# Patient Record
Sex: Male | Born: 1985 | Smoking: Former smoker
Health system: Southern US, Community
[De-identification: ages and names within clinical notes are randomized; demographics above are authoritative.]

## PROBLEM LIST (undated history)

## (undated) DIAGNOSIS — I639 Cerebral infarction, unspecified: Secondary | ICD-10-CM

## (undated) DIAGNOSIS — I10 Essential (primary) hypertension: Secondary | ICD-10-CM

## (undated) DIAGNOSIS — N189 Chronic kidney disease, unspecified: Secondary | ICD-10-CM

## (undated) HISTORY — PX: NO PAST SURGERIES: SHX2092

---

## 2013-12-21 DIAGNOSIS — I639 Cerebral infarction, unspecified: Secondary | ICD-10-CM

## 2013-12-21 HISTORY — DX: Cerebral infarction, unspecified: I63.9

## 2017-06-08 DIAGNOSIS — N186 End stage renal disease: Secondary | ICD-10-CM

## 2018-08-19 ENCOUNTER — Other Ambulatory Visit
Admission: RE | Admit: 2018-08-19 | Discharge: 2018-08-19 | Disposition: A | Discharge status: Home / Self Care | Payer: Medicare Other | Source: Ambulatory Visit | Attending: Nephrology | Admitting: Nephrology

## 2018-08-19 DIAGNOSIS — N186 End stage renal disease: Secondary | ICD-10-CM | POA: Diagnosis present

## 2018-08-19 LAB — POTASSIUM: POTASSIUM: 3.5 mmol/L (ref 3.5–5.1)

## 2018-10-14 ENCOUNTER — Other Ambulatory Visit
Admission: RE | Admit: 2018-10-14 | Discharge: 2018-10-14 | Disposition: A | Discharge status: Home / Self Care | Payer: Medicare Other | Source: Ambulatory Visit | Attending: Nephrology | Admitting: Nephrology

## 2018-10-14 DIAGNOSIS — N186 End stage renal disease: Secondary | ICD-10-CM | POA: Diagnosis present

## 2018-10-14 LAB — PHOSPHORUS: Phosphorus: 5.6 mg/dL — ABNORMAL HIGH (ref 2.5–4.6)

## 2019-02-05 ENCOUNTER — Emergency Department: Payer: Medicare Other

## 2019-02-05 ENCOUNTER — Inpatient Hospital Stay
Admission: EM | Admit: 2019-02-05 | Discharge: 2019-02-07 | DRG: 377 | Disposition: A | Discharge status: Home / Self Care | Payer: Medicare Other | Attending: Internal Medicine | Admitting: Internal Medicine

## 2019-02-05 ENCOUNTER — Other Ambulatory Visit: Payer: Self-pay

## 2019-02-05 DIAGNOSIS — K922 Gastrointestinal hemorrhage, unspecified: Secondary | ICD-10-CM

## 2019-02-05 DIAGNOSIS — N186 End stage renal disease: Secondary | ICD-10-CM | POA: Diagnosis present

## 2019-02-05 DIAGNOSIS — K2270 Barrett's esophagus without dysplasia: Secondary | ICD-10-CM | POA: Diagnosis present

## 2019-02-05 DIAGNOSIS — R531 Weakness: Secondary | ICD-10-CM

## 2019-02-05 DIAGNOSIS — I120 Hypertensive chronic kidney disease with stage 5 chronic kidney disease or end stage renal disease: Secondary | ICD-10-CM | POA: Diagnosis present

## 2019-02-05 DIAGNOSIS — K259 Gastric ulcer, unspecified as acute or chronic, without hemorrhage or perforation: Secondary | ICD-10-CM | POA: Diagnosis present

## 2019-02-05 DIAGNOSIS — K921 Melena: Secondary | ICD-10-CM | POA: Diagnosis present

## 2019-02-05 DIAGNOSIS — Z716 Tobacco abuse counseling: Secondary | ICD-10-CM

## 2019-02-05 DIAGNOSIS — D62 Acute posthemorrhagic anemia: Secondary | ICD-10-CM | POA: Diagnosis present

## 2019-02-05 DIAGNOSIS — Z791 Long term (current) use of non-steroidal anti-inflammatories (NSAID): Secondary | ICD-10-CM | POA: Diagnosis not present

## 2019-02-05 DIAGNOSIS — N2581 Secondary hyperparathyroidism of renal origin: Secondary | ICD-10-CM | POA: Diagnosis present

## 2019-02-05 DIAGNOSIS — Z8679 Personal history of other diseases of the circulatory system: Secondary | ICD-10-CM

## 2019-02-05 DIAGNOSIS — I69354 Hemiplegia and hemiparesis following cerebral infarction affecting left non-dominant side: Secondary | ICD-10-CM

## 2019-02-05 DIAGNOSIS — D631 Anemia in chronic kidney disease: Secondary | ICD-10-CM | POA: Diagnosis present

## 2019-02-05 DIAGNOSIS — K269 Duodenal ulcer, unspecified as acute or chronic, without hemorrhage or perforation: Secondary | ICD-10-CM | POA: Diagnosis present

## 2019-02-05 DIAGNOSIS — Z992 Dependence on renal dialysis: Secondary | ICD-10-CM

## 2019-02-05 DIAGNOSIS — F17210 Nicotine dependence, cigarettes, uncomplicated: Secondary | ICD-10-CM | POA: Diagnosis present

## 2019-02-05 DIAGNOSIS — D649 Anemia, unspecified: Secondary | ICD-10-CM

## 2019-02-05 DIAGNOSIS — K2289 Other specified disease of esophagus: Secondary | ICD-10-CM

## 2019-02-05 DIAGNOSIS — K3189 Other diseases of stomach and duodenum: Secondary | ICD-10-CM | POA: Diagnosis present

## 2019-02-05 DIAGNOSIS — Z79899 Other long term (current) drug therapy: Secondary | ICD-10-CM | POA: Diagnosis not present

## 2019-02-05 DIAGNOSIS — K228 Other specified diseases of esophagus: Secondary | ICD-10-CM | POA: Diagnosis not present

## 2019-02-05 DIAGNOSIS — Z8249 Family history of ischemic heart disease and other diseases of the circulatory system: Secondary | ICD-10-CM | POA: Diagnosis not present

## 2019-02-05 HISTORY — DX: Cerebral infarction, unspecified: I63.9

## 2019-02-05 HISTORY — DX: Essential (primary) hypertension: I10

## 2019-02-05 HISTORY — DX: Chronic kidney disease, unspecified: N18.9

## 2019-02-05 LAB — MRSA PCR SCREENING: MRSA by PCR: NEGATIVE

## 2019-02-05 LAB — CBC WITH DIFFERENTIAL/PLATELET
Abs Immature Granulocytes: 0.04 10*3/uL (ref 0.00–0.07)
Basophils Absolute: 0 10*3/uL (ref 0.0–0.1)
Basophils Relative: 0 %
EOS PCT: 1 %
Eosinophils Absolute: 0.1 10*3/uL (ref 0.0–0.5)
HEMATOCRIT: 16.9 % — AB (ref 39.0–52.0)
HEMOGLOBIN: 5.6 g/dL — AB (ref 13.0–17.0)
IMMATURE GRANULOCYTES: 0 %
LYMPHS ABS: 1.6 10*3/uL (ref 0.7–4.0)
LYMPHS PCT: 15 %
MCH: 31.8 pg (ref 26.0–34.0)
MCHC: 33.1 g/dL (ref 30.0–36.0)
MCV: 96 fL (ref 80.0–100.0)
MONOS PCT: 9 %
Monocytes Absolute: 0.9 10*3/uL (ref 0.1–1.0)
NEUTROS PCT: 75 %
Neutro Abs: 7.9 10*3/uL — ABNORMAL HIGH (ref 1.7–7.7)
Platelets: 197 10*3/uL (ref 150–400)
RBC: 1.76 MIL/uL — ABNORMAL LOW (ref 4.22–5.81)
RDW: 13.5 % (ref 11.5–15.5)
WBC: 10.6 10*3/uL — ABNORMAL HIGH (ref 4.0–10.5)
nRBC: 0 % (ref 0.0–0.2)

## 2019-02-05 LAB — BASIC METABOLIC PANEL
Anion gap: 17 — ABNORMAL HIGH (ref 5–15)
BUN: 126 mg/dL — ABNORMAL HIGH (ref 6–20)
CHLORIDE: 90 mmol/L — AB (ref 98–111)
CO2: 26 mmol/L (ref 22–32)
Calcium: 8.5 mg/dL — ABNORMAL LOW (ref 8.9–10.3)
Creatinine, Ser: 12.84 mg/dL — ABNORMAL HIGH (ref 0.61–1.24)
GFR calc Af Amer: 5 mL/min — ABNORMAL LOW (ref >60)
GFR calc non Af Amer: 5 mL/min — ABNORMAL LOW (ref >60)
GLUCOSE: 99 mg/dL (ref 70–99)
Potassium: 3.6 mmol/L (ref 3.5–5.1)
Sodium: 133 mmol/L — ABNORMAL LOW (ref 135–145)

## 2019-02-05 LAB — HEMOGLOBIN: Hemoglobin: 5.4 g/dL — ABNORMAL LOW (ref 13.0–17.0)

## 2019-02-05 LAB — ABO/RH: ABO/RH(D): O POS

## 2019-02-05 LAB — PREPARE RBC (CROSSMATCH)

## 2019-02-05 MED ORDER — SODIUM CHLORIDE 0.9 % IV SOLN: 10.0000 mL/h | Freq: Once | INTRAVENOUS | Status: DC

## 2019-02-05 MED ORDER — DOCUSATE SODIUM 100 MG PO CAPS: 100.0000 mg | ORAL_CAPSULE | Freq: Two times a day (BID) | ORAL | Status: DC | PRN

## 2019-02-05 MED ORDER — PANTOPRAZOLE SODIUM 40 MG IV SOLR
40.0000 mg | Freq: Two times a day (BID) | INTRAVENOUS | Status: DC
  Administered 2019-02-05 – 2019-02-06 (×3): 40 mg via INTRAVENOUS
  Filled 2019-02-05 (×4): qty 40

## 2019-02-05 MED ORDER — PANTOPRAZOLE SODIUM 40 MG IV SOLR: 40.0000 mg | Freq: Two times a day (BID) | INTRAVENOUS | Status: DC

## 2019-02-05 MED ORDER — SODIUM CHLORIDE 0.9 % IV SOLN
80.0000 mg | Freq: Once | INTRAVENOUS | Status: AC
  Administered 2019-02-05: 80 mg via INTRAVENOUS
  Filled 2019-02-05: qty 80

## 2019-02-05 MED ORDER — SODIUM CHLORIDE 0.9 % IV SOLN
Freq: Once | INTRAVENOUS | Status: AC
  Administered 2019-02-05: 17:00:00 via INTRAVENOUS

## 2019-02-05 MED ORDER — SODIUM CHLORIDE 0.9 % IV SOLN
8.0000 mg/h | INTRAVENOUS | Status: DC
  Administered 2019-02-05 – 2019-02-06 (×4): 8 mg/h via INTRAVENOUS
  Filled 2019-02-05 (×3): qty 80

## 2019-02-05 NOTE — ED Notes (Signed)
Hemoccult positive 

## 2019-02-05 NOTE — ED Triage Notes (Signed)
Pt arrived via [facility] from home with c/o weakness for the last couple of days. EMS states pt had dialysis on Friday and has been progressively getting weaker. EMS states today pt had trouble walking and standing on his own.

## 2019-02-05 NOTE — ED Notes (Signed)
Rate changed to 150 ml/hr per MD order.

## 2019-02-05 NOTE — ED Notes (Signed)
ED TO INPATIENT HANDOFF REPORT  Name/Age/Gender Jose Vance 33 y.o. male  Code Status Full  Home/SNF/Other Home  Chief Complaint weakness  Level of Care/Admitting Diagnosis ED Disposition    ED Disposition Condition [place]: [place] [100120]  Level of Care: Med-Surg [16]  Diagnosis: GI bleed [950932]  Admitting Physician: Vaughan Basta [[id number]]  Attending Physician: Vaughan Basta [phone]  Estimated length of stay: past midnight tomorrow  Certification:: I certify this patient will need inpatient services for at least 2 midnights  PT Class (Do Not Modify): Inpatient [101]  PT Acc Code (Do Not Modify): Private [1]       Medical History Past Medical History:  Diagnosis Date  . Chronic kidney disease   . Hypertension   . Stroke [facility]) 2015   partial left sided weakness    Allergies No Known Allergies  IV Location/Drains/Wounds Patient Lines/Drains/Airways Status   Active Line/Drains/Airways    Name:   Placement date:   Placement time:   Site:   Days:   Peripheral IV 02/05/19 Left Forearm   02/05/19    -    Forearm   less than 1   Peripheral IV 02/05/19 Left Forearm   02/05/19    1918    Forearm   less than 1          Labs/Imaging Results for orders placed or performed during the hospital encounter of 02/05/19 (from the past 48 hour(s))  CBC with Differential     Status: Abnormal   Collection Time: 02/05/19  3:14 PM  Result Value Ref Range   WBC 10.6 (H) 4.0 - 10.5 K/uL   RBC 1.76 (L) 4.22 - 5.81 MIL/uL   Hemoglobin 5.6 (L) 13.0 - 17.0 g/dL   HCT 16.9 (L) 39.0 - 52.0 %   MCV 96.0 80.0 - 100.0 fL   MCH 31.8 26.0 - 34.0 pg   MCHC 33.1 30.0 - 36.0 g/dL   RDW 13.5 11.5 - 15.5 %   Platelets 197 150 - 400 K/uL   nRBC 0.0 0.0 - 0.2 %   Neutrophils Relative % 75 %   Neutro Abs 7.9 (H) 1.7 - 7.7 K/uL   Lymphocytes Relative 15 %   Lymphs Abs 1.6 0.7 - 4.0 K/uL   Monocytes Relative 9 %   Monocytes Absolute 0.9 0.1 - 1.0 K/uL   Eosinophils Relative 1 %   Eosinophils Absolute 0.1 0.0 - 0.5 K/uL   Basophils Relative 0 %   Basophils Absolute 0.0 0.0 - 0.1 K/uL   Immature Granulocytes 0 %   Abs Immature Granulocytes 0.04 0.00 - 0.07 K/uL    Comment: Performed at [facility], [address] [place], [place] [address]  Basic metabolic panel     Status: Abnormal   Collection Time: 02/05/19  3:14 PM  Result Value Ref Range   Sodium 133 (L) 135 - 145 mmol/L   Potassium 3.6 3.5 - 5.1 mmol/L   Chloride 90 (L) 98 - 111 mmol/L   CO2 26 22 - 32 mmol/L   Glucose, Bld 99 70 - 99 mg/dL   BUN 126 (H) 6 - 20 mg/dL    Comment: RESULT CONFIRMED BY MANUAL DILUTION.PMF   Creatinine, Ser 12.84 (H) 0.61 - 1.24 mg/dL   Calcium 8.5 (L) 8.9 - 10.3 mg/dL   GFR calc non Af Amer 5 (L) >60 mL/min   GFR calc Af Amer 5 (L) >60 mL/min   Anion gap 17 (H)  5 - 15    Comment: Performed at [facility], [address] [place], [place] [address]  ABO/Rh     Status: None   Collection Time: 02/05/19  3:14 PM  Result Value Ref Range   ABO/RH(D)      O POS Performed at [facility], [address] [place], [place] [address]   Prepare RBC     Status: None   Collection Time: 02/05/19  5:07 PM  Result Value Ref Range   Order Confirmation      ORDER PROCESSED BY BLOOD BANK Performed at [facility], [address] [place], [place] [address]   Type and screen [place]     Status: None (Preliminary result)   Collection Time: 02/05/19  5:15 PM  Result Value Ref Range   ABO/RH(D) O POS    Antibody Screen NEG    Sample Expiration 02/08/2019    Unit Number [id number]    Blood Component Type RED CELLS,LR    Unit division 00    Status of Unit ISSUED    Transfusion Status OK TO TRANSFUSE    Crossmatch Result      Compatible Performed at [facility], [address] [place], [place] [address]    Unit Number [id number]     Blood Component Type RED CELLS,LR    Unit division 00    Status of Unit ALLOCATED    Transfusion Status OK TO TRANSFUSE    Crossmatch Result Compatible    Unit Number [id number]    Blood Component Type RED CELLS,LR    Unit division 00    Status of Unit ALLOCATED    Transfusion Status OK TO TRANSFUSE    Crossmatch Result Compatible   Hemoglobin     Status: Abnormal   Collection Time: 02/05/19  5:15 PM  Result Value Ref Range   Hemoglobin 5.4 (L) 13.0 - 17.0 g/dL    Comment: Performed at [facility], [address], [place], [place] [address]   Dg Chest 2 View  Result Date: 02/05/2019 CLINICAL DATA:  Weakness.  Shortness of breath. EXAM: CHEST - 2 VIEW COMPARISON:  None. FINDINGS: The heart size and mediastinal contours are within normal limits. Both lungs are clear. The visualized skeletal structures are unremarkable. IMPRESSION: No active cardiopulmonary disease. Electronically Signed   By: Dorise Bullion III M.D   On: 02/05/2019 17:38    Pending Labs Unresulted Labs (From admission, onward)    Start     Ordered   02/05/19 1817  Hemoglobin  Now then every 8 hours,   STAT    Comments:  Call MD, If < 7, Or drop  More than 1 gram/dl from previous report.    02/05/19 1816   Signed and Held  HIV antibody (Routine Testing)  Once,   R     Signed and Held   Signed and Held  Basic metabolic panel  Tomorrow morning,   R     Signed and Held   Signed and Held  CBC  Tomorrow morning,   R     Signed and Held          Vitals/Pain Today's Vitals   02/05/19 1954 02/05/19 1955 02/05/19 1956 02/05/19 1957  BP:  113/80    Pulse: 91 89  97  Resp: 15 13 12 18   Temp:      TempSrc:      SpO2: 100% 100%  100%  Weight:  Height:      PainSc:        Isolation Precautions No active isolations  Medications Medications  0.9 %  sodium chloride infusion (has no administration in time range)  pantoprazole (PROTONIX) 80 mg in sodium chloride 0.9 % 250 mL (0.32 mg/mL)  infusion (8 mg/hr Intravenous New Bag/Given 02/05/19 1818)  pantoprazole (PROTONIX) injection 40 mg (40 mg Intravenous Given 02/05/19 1753)  0.9 %  sodium chloride infusion ( Intravenous Hold 02/05/19 1705)  pantoprazole (PROTONIX) 80 mg in sodium chloride 0.9 % 100 mL IVPB (0 mg Intravenous Stopped 02/05/19 1816)    Mobility X1 assist

## 2019-02-05 NOTE — ED Provider Notes (Signed)
[facility] Emergency Department Provider Note  ____________________________________________   I have reviewed the triage vital signs and the nursing notes.   HISTORY  Chief Complaint Weakness   History limited by: Not Limited   HPI Jose Vance is a 33 y.o. male who presents to the emergency department today because of concerns for weakness.  He states that he weakness started 2 days ago after he finished dialysis.  The patient started feeling a little bit weak and dizzy during dialysis but it has been progressive since then.  He states it was his normal dialysis session they did not take more fluid than normal.  He is noticed that he becomes quite dizzy and lightheaded when he gets up or when he tries to walk.  This has been accompanied by shortness of breath.  He does still produce some urine but denies any recent change in his urine.  Denies any fevers.   Per medical record review patient has a history of stroke  Past Medical History:  Diagnosis Date  . Stroke [facility]) 2015   partial left sided weakness    There are no active problems to display for this patient.   Prior to Admission medications   Not on File    Allergies Patient has no known allergies.  No family history on file.  Social History Social History   Tobacco Use  . Smoking status: Not on file  Substance Use Topics  . Alcohol use: Not on file  . Drug use: Not on file    Review of Systems Constitutional: No fever/chills. Positive for generalized weakness.  Eyes: No visual changes. ENT: No sore throat. Cardiovascular: Denies chest pain. Respiratory: Positive for shortness of breath. Gastrointestinal: No abdominal pain.  No nausea, no vomiting.  No diarrhea.   Genitourinary: Negative for dysuria. Musculoskeletal: Negative for back pain. Skin: Negative for rash. Neurological: Negative for headaches, focal weakness or  numbness.  ____________________________________________   PHYSICAL EXAM:  VITAL SIGNS: ED Triage Vitals [02/05/19 1509]  Enc Vitals Group     BP 139/85     Pulse Rate (!) 116     Resp 18     Temp 98.4 F (36.9 C)     Temp Source Oral     SpO2 100 %     Weight 140 lb 10.5 oz (63.8 kg)     Height 5\' 6"  (1.676 m)     Head Circumference      Peak Flow      Pain Score 0   Constitutional: Alert and oriented.  Eyes: Conjunctivae are normal.  ENT      Head: Normocephalic and atraumatic.      Nose: No congestion/rhinnorhea.      Mouth/Throat: Mucous membranes are moist.      Neck: No stridor. Hematological/Lymphatic/Immunilogical: No cervical lymphadenopathy. Cardiovascular: Tachycardic, regular rhythm.  No murmurs, rubs, or gallops.  Respiratory: Normal respiratory effort without tachypnea nor retractions. Breath sounds are clear and equal bilaterally. No wheezes/rales/rhonchi. Gastrointestinal: Soft and non tender. No rebound. No guarding.  Rectal: Melena Musculoskeletal: Normal range of motion in all extremities. No lower extremity edema. Neurologic:  Normal speech and language. No gross focal neurologic deficits are appreciated.  Skin:  Skin is warm, dry and intact. No rash noted. Psychiatric: Mood and affect are normal. Speech and behavior are normal. Patient exhibits appropriate insight and judgment.  ____________________________________________    LABS (pertinent positives/negatives)  BMP na 133, cl 90, cr 12.84 CBC wbc 10.6, hgb 5.6, plt  197  ____________________________________________   EKG  INance Pear, attending physician, personally viewed and interpreted this EKG  EKG Time: 1512 Rate: 104 Rhythm: sinus tachycardia Axis: right axis deviation Intervals: qtc 454 QRS: narrow ST changes: no st elevation Impression: abnormal ekg   ____________________________________________    RADIOLOGY  CXR No acute  abnormality  ____________________________________________   PROCEDURES  Procedures  CRITICAL CARE Performed by: Nance Pear   Total critical care time: 35 minutes  Critical care time was exclusive of separately billable procedures and treating other patients.  Critical care was necessary to treat or prevent imminent or life-threatening deterioration.  Critical care was time spent personally by me on the following activities: development of treatment plan with patient and/or surrogate as well as nursing, discussions with consultants, evaluation of patient's response to treatment, examination of patient, obtaining history from patient or surrogate, ordering and performing treatments and interventions, ordering and review of laboratory studies, ordering and review of radiographic studies, pulse oximetry and re-evaluation of patient's condition.  ____________________________________________   INITIAL IMPRESSION / ASSESSMENT AND PLAN / ED COURSE  Pertinent labs & imaging results that were available during my care of the patient were reviewed by me and considered in my medical decision making (see chart for details).   Patient presented to the emergency department today because of concerns for weakness.  Was found to be significantly anemic.  He was guaiac positive with melanotic stool.  Did discuss with Dr. Juleen China with nephrology.  Will plan on admission.  Discussed and consented patient for blood transfusion. Will start patient on protonix for GI bleed.   ____________________________________________   FINAL CLINICAL IMPRESSION(S) / ED DIAGNOSES  Final diagnoses:  Weakness  Gastrointestinal hemorrhage, unspecified gastrointestinal hemorrhage type  Anemia, unspecified type     Note: This dictation was prepared with Dragon dictation. Any transcriptional errors that result from this process are unintentional     Nance Pear, MD 02/05/19 1753

## 2019-02-05 NOTE — H&P (Signed)
[facility] NAME: Jose Vance    MR#:  [id number]  DATE OF BIRTH:  11/01/1986  DATE OF ADMISSION:  02/05/2019  PRIMARY CARE PHYSICIAN: Patient, No Pcp Per   REQUESTING/REFERRING PHYSICIAN: Archie Balboa  CHIEF COMPLAINT:   Chief Complaint  Patient presents with  . Weakness    HISTORY OF PRESENT ILLNESS: Jose Vance  is a 33 y.o. male with a known history of CKD- ESRD on HD, Htn, Stroke- had dark stool and dark colored vomit for last 1-2 days, without any pain in abd. He felt very dizzi on trying to stand up today so came to ER> Noted to have Hb of 5. Dark tarry stool, so ER ordered blood transfusion, and requested hospitalist to admit. PAST MEDICAL HISTORY:   Past Medical History:  Diagnosis Date  . Chronic kidney disease   . Hypertension   . Stroke [facility]) 2015   partial left sided weakness    PAST SURGICAL HISTORY: No past surgical history  SOCIAL HISTORY:  Social History   Tobacco Use  . Smoking status: Current Every Day Smoker    Packs/day: 0.50  Substance Use Topics  . Alcohol use: Not on file    FAMILY HISTORY:  Family History  Problem Relation Age of Onset  . Hypertension Mother     DRUG ALLERGIES: No Known Allergies  REVIEW OF SYSTEMS:   CONSTITUTIONAL: No fever, have fatigue or weakness.  EYES: No blurred or double vision.  EARS, NOSE, AND THROAT: No tinnitus or ear pain.  RESPIRATORY: No cough, shortness of breath, wheezing or hemoptysis.  CARDIOVASCULAR: No chest pain, orthopnea, edema.  GASTROINTESTINAL: No nausea, have vomiting, no diarrhea or abdominal pain.  GENITOURINARY: No dysuria, hematuria.  ENDOCRINE: No polyuria, nocturia,  HEMATOLOGY: No anemia, easy bruising or bleeding SKIN: No rash or lesion. MUSCULOSKELETAL: No joint pain or arthritis.   NEUROLOGIC: No tingling, numbness, weakness.  PSYCHIATRY: No anxiety or depression.   MEDICATIONS AT HOME:  Prior to Admission medications    Medication Sig Start Date End Date Taking? Authorizing Provider  calcium acetate, Phos Binder, (PHOSLYRA) 667 MG/5ML SOLN Take 40 mLs by mouth 3 (three) times daily. 08/02/18  Yes [provider]  lisinopril (PRINIVIL,ZESTRIL) 20 MG tablet Take 20 mg by mouth daily. 08/02/18  Yes [provider]      PHYSICAL EXAMINATION:   VITAL SIGNS: Blood pressure 131/79, pulse 99, temperature 98.4 F (36.9 C), temperature source Oral, resp. rate 18, height 5\' 6"  (1.676 m), weight 63.8 kg, SpO2 98 %.  GENERAL:  33 y.o.-year-old patient lying in the bed with no acute distress.  EYES: Pupils equal, round, reactive to light and accommodation. No scleral icterus. Extraocular muscles intact.  Conjunctive are pale. HEENT: Head atraumatic, normocephalic. Oropharynx and nasopharynx clear.  NECK:  Supple, no jugular venous distention. No thyroid enlargement, no tenderness.  LUNGS: Normal breath sounds bilaterally, no wheezing, rales,rhonchi or crepitation. No use of accessory muscles of respiration.  CARDIOVASCULAR: S1, S2 normal. No murmurs, rubs, or gallops.  ABDOMEN: Soft, nontender, nondistended. Bowel sounds present. No organomegaly or mass.  EXTREMITIES: No pedal edema, cyanosis, or clubbing.  NEUROLOGIC: Cranial nerves II through XII are intact. Muscle strength 5/5 in all extremities. Sensation intact. Gait not checked.  PSYCHIATRIC: The patient is alert and oriented x 3.  SKIN: No obvious rash, lesion, or ulcer.   LABORATORY PANEL:   CBC Recent Labs  Lab 02/05/19 1514 02/05/19 1715  WBC 10.6*  --  HGB 5.6* 5.4*  HCT 16.9*  --   PLT 197  --   MCV 96.0  --   MCH 31.8  --   MCHC 33.1  --   RDW 13.5  --   LYMPHSABS 1.6  --   MONOABS 0.9  --   EOSABS 0.1  --   BASOSABS 0.0  --    ------------------------------------------------------------------------------------------------------------------  Chemistries  Recent Labs  Lab 02/05/19 1514  NA 133*  K 3.6  CL 90*  CO2  26  GLUCOSE 99  BUN 126*  CREATININE 12.84*  CALCIUM 8.5*   ------------------------------------------------------------------------------------------------------------------ estimated creatinine clearance is 7.5 mL/min (A) (by C-G formula based on SCr of 12.84 mg/dL (H)). ------------------------------------------------------------------------------------------------------------------ No results for input(s): TSH, T4TOTAL, T3FREE, THYROIDAB in the last 72 hours.  Invalid input(s): FREET3   Coagulation profile No results for input(s): INR, PROTIME in the last 168 hours. ------------------------------------------------------------------------------------------------------------------- No results for input(s): DDIMER in the last 72 hours. -------------------------------------------------------------------------------------------------------------------  Cardiac Enzymes No results for input(s): CKMB, TROPONINI, MYOGLOBIN in the last 168 hours.  Invalid input(s): CK ------------------------------------------------------------------------------------------------------------------ Invalid input(s): POCBNP  ---------------------------------------------------------------------------------------------------------------  Urinalysis No results found for: COLORURINE, APPEARANCEUR, LABSPEC, PHURINE, GLUCOSEU, HGBUR, BILIRUBINUR, KETONESUR, PROTEINUR, UROBILINOGEN, NITRITE, LEUKOCYTESUR   RADIOLOGY: Dg Chest 2 View  Result Date: 02/05/2019 CLINICAL DATA:  Weakness.  Shortness of breath. EXAM: CHEST - 2 VIEW COMPARISON:  None. FINDINGS: The heart size and mediastinal contours are within normal limits. Both lungs are clear. The visualized skeletal structures are unremarkable. IMPRESSION: No active cardiopulmonary disease. Electronically Signed   By: Dorise Bullion III M.D   On: 02/05/2019 17:38    EKG: Orders placed or performed during the hospital encounter of 02/05/19  . EKG 12-Lead  .  EKG 12-Lead    IMPRESSION AND PLAN:  *Symptomatic anemia Active GI bleed  Started on Protonix IV drip and ordered blood transfusion by ER. Follow serial hemoglobins. I have texted GI on call. Keep on clear liquid diet for now. Patient denies use of NSAIDs.  *End-stage renal disease on hemodialysis Nephrologist is contacted for managing dialysis while inpatient.  *Hypertension Currently blood pressure is stable and as he is having GI bleed I would like to hold on any medications.  *Active smoking Counseled to quit smoking for 4 minutes and offered nicotine patch.  All the records are reviewed and case discussed with ED provider. Management plans discussed with the patient, family and they are in agreement.  CODE STATUS: Full code    TOTAL TIME TAKING CARE OF THIS PATIENT: 45 minutes.    Vaughan Basta M.D on 02/05/2019   Between 7am to 6pm - Pager - [phone]  After 6pm go to [email or web address] - password [email or web address] [facility]  Office  [phone]  CC: Primary care physician; Patient, No Pcp Per   Note: This dictation was prepared with Dragon dictation along with smaller phrase technology. Any transcriptional errors that result from this process are unintentional.

## 2019-02-06 ENCOUNTER — Encounter: Payer: Self-pay | Admitting: *Deleted

## 2019-02-06 ENCOUNTER — Inpatient Hospital Stay: Payer: Medicare Other | Admitting: Anesthesiology

## 2019-02-06 ENCOUNTER — Encounter
Admission: EM | Disposition: A | Discharge status: Home / Self Care | Payer: Self-pay | Source: Home / Self Care | Attending: Internal Medicine

## 2019-02-06 DIAGNOSIS — K921 Melena: Secondary | ICD-10-CM

## 2019-02-06 DIAGNOSIS — K2289 Other specified disease of esophagus: Secondary | ICD-10-CM

## 2019-02-06 DIAGNOSIS — K228 Other specified diseases of esophagus: Secondary | ICD-10-CM

## 2019-02-06 DIAGNOSIS — K269 Duodenal ulcer, unspecified as acute or chronic, without hemorrhage or perforation: Secondary | ICD-10-CM

## 2019-02-06 DIAGNOSIS — Z791 Long term (current) use of non-steroidal anti-inflammatories (NSAID): Secondary | ICD-10-CM

## 2019-02-06 DIAGNOSIS — D649 Anemia, unspecified: Secondary | ICD-10-CM

## 2019-02-06 HISTORY — PX: ESOPHAGOGASTRODUODENOSCOPY: SHX5428

## 2019-02-06 LAB — BLOOD GAS, ARTERIAL
Acid-base deficit: 2.3 mmol/L — ABNORMAL HIGH (ref 0.0–2.0)
Bicarbonate: 21.4 mmol/L (ref 20.0–28.0)
FIO2: 0.21
O2 Saturation: 98.8 %
PCO2 ART: 33 mmHg (ref 32.0–48.0)
Patient temperature: 37
pH, Arterial: 7.42 (ref 7.350–7.450)
pO2, Arterial: 121 mmHg — ABNORMAL HIGH (ref 83.0–108.0)

## 2019-02-06 LAB — CBC
HCT: 17.8 % — ABNORMAL LOW (ref 39.0–52.0)
Hemoglobin: 5.9 g/dL — ABNORMAL LOW (ref 13.0–17.0)
MCH: 30.9 pg (ref 26.0–34.0)
MCHC: 33.1 g/dL (ref 30.0–36.0)
MCV: 93.2 fL (ref 80.0–100.0)
Platelets: 144 10*3/uL — ABNORMAL LOW (ref 150–400)
RBC: 1.91 MIL/uL — ABNORMAL LOW (ref 4.22–5.81)
RDW: 15.1 % (ref 11.5–15.5)
WBC: 9.3 10*3/uL (ref 4.0–10.5)
nRBC: 0 % (ref 0.0–0.2)

## 2019-02-06 LAB — HEMOGLOBIN AND HEMATOCRIT, BLOOD
HCT: 22.1 % — ABNORMAL LOW (ref 39.0–52.0)
HCT: 25.4 % — ABNORMAL LOW (ref 39.0–52.0)
Hemoglobin: 7.5 g/dL — ABNORMAL LOW (ref 13.0–17.0)
Hemoglobin: 8.4 g/dL — ABNORMAL LOW (ref 13.0–17.0)

## 2019-02-06 LAB — GLUCOSE, CAPILLARY
Glucose-Capillary: 69 mg/dL — ABNORMAL LOW (ref 70–99)
Glucose-Capillary: 159 mg/dL — ABNORMAL HIGH (ref 70–99)

## 2019-02-06 LAB — BASIC METABOLIC PANEL
Anion gap: 16 — ABNORMAL HIGH (ref 5–15)
BUN: 133 mg/dL — ABNORMAL HIGH (ref 6–20)
CALCIUM: 8.3 mg/dL — AB (ref 8.9–10.3)
CO2: 23 mmol/L (ref 22–32)
Chloride: 98 mmol/L (ref 98–111)
Creatinine, Ser: 14.01 mg/dL — ABNORMAL HIGH (ref 0.61–1.24)
GFR calc Af Amer: 5 mL/min — ABNORMAL LOW (ref >60)
GFR calc non Af Amer: 4 mL/min — ABNORMAL LOW (ref >60)
Glucose, Bld: 94 mg/dL (ref 70–99)
Potassium: 3.9 mmol/L (ref 3.5–5.1)
SODIUM: 137 mmol/L (ref 135–145)

## 2019-02-06 LAB — PREPARE RBC (CROSSMATCH)

## 2019-02-06 LAB — INFLUENZA PANEL BY PCR (TYPE A & B)
Influenza A By PCR: NEGATIVE
Influenza B By PCR: NEGATIVE

## 2019-02-06 LAB — HEMOGLOBIN
Hemoglobin: 7.3 g/dL — ABNORMAL LOW (ref 13.0–17.0)
Hemoglobin: 7.7 g/dL — ABNORMAL LOW (ref 13.0–17.0)

## 2019-02-06 SURGERY — EGD (ESOPHAGOGASTRODUODENOSCOPY)
Anesthesia: General | Laterality: Left

## 2019-02-06 MED ORDER — PROPOFOL 10 MG/ML IV BOLUS
INTRAVENOUS | Status: AC
  Filled 2019-02-06: qty 20

## 2019-02-06 MED ORDER — MORPHINE SULFATE (PF) 2 MG/ML IV SOLN: 1.0000 mg | Freq: Four times a day (QID) | INTRAVENOUS | Status: DC | PRN

## 2019-02-06 MED ORDER — MIDAZOLAM HCL 2 MG/2ML IJ SOLN
INTRAMUSCULAR | Status: AC
  Filled 2019-02-06: qty 2

## 2019-02-06 MED ORDER — DEXTROSE 50 % IV SOLN
12.5000 g | Freq: Once | INTRAVENOUS | Status: AC
  Administered 2019-02-06: 12.5 g via INTRAVENOUS
  Filled 2019-02-06 (×2): qty 50

## 2019-02-06 MED ORDER — PROPOFOL 500 MG/50ML IV EMUL
INTRAVENOUS | Status: DC | PRN
  Administered 2019-02-06: 120 ug/kg/min via INTRAVENOUS

## 2019-02-06 MED ORDER — PROPOFOL 10 MG/ML IV BOLUS
INTRAVENOUS | Status: DC | PRN
  Administered 2019-02-06: 100 mg via INTRAVENOUS

## 2019-02-06 MED ORDER — MIDAZOLAM HCL 5 MG/5ML IJ SOLN
INTRAMUSCULAR | Status: DC | PRN
  Administered 2019-02-06: 2 mg via INTRAVENOUS

## 2019-02-06 MED ORDER — SODIUM CHLORIDE 0.9 % IV SOLN
INTRAVENOUS | Status: DC
  Administered 2019-02-06: 16:00:00 via INTRAVENOUS

## 2019-02-06 MED ORDER — EPOETIN ALFA 10000 UNIT/ML IJ SOLN
10000.0000 [IU] | Freq: Once | INTRAMUSCULAR | Status: AC
  Administered 2019-02-06: 10000 [IU] via SUBCUTANEOUS

## 2019-02-06 MED ORDER — SODIUM CHLORIDE 0.9% IV SOLUTION
Freq: Once | INTRAVENOUS | Status: AC
  Administered 2019-02-06: 04:00:00 via INTRAVENOUS

## 2019-02-06 MED ORDER — ACETAMINOPHEN 325 MG PO TABS
650.0000 mg | ORAL_TABLET | Freq: Four times a day (QID) | ORAL | Status: DC | PRN
  Administered 2019-02-06 – 2019-02-07 (×2): 650 mg via ORAL
  Filled 2019-02-06 (×2): qty 2

## 2019-02-06 MED ORDER — ONDANSETRON HCL 4 MG/2ML IJ SOLN
4.0000 mg | Freq: Four times a day (QID) | INTRAMUSCULAR | Status: DC | PRN
  Administered 2019-02-06: 4 mg via INTRAVENOUS
  Filled 2019-02-06 (×2): qty 2

## 2019-02-06 MED ORDER — SODIUM CHLORIDE 0.9% IV SOLUTION
Freq: Once | INTRAVENOUS | Status: AC
  Administered 2019-02-06: 19:00:00 via INTRAVENOUS

## 2019-02-06 NOTE — Progress Notes (Signed)
Post HD assessment    02/06/19 1304  Neurological  Level of Consciousness Alert  Orientation Level Oriented X4  Respiratory  Respiratory Pattern Regular;Unlabored  Chest Assessment Chest expansion symmetrical  Cardiac  Pulse Regular  ECG Monitor Yes  Cardiac Rhythm NSR;ST  Vascular  R Radial Pulse +2  L Radial Pulse +2  Integumentary  Integumentary (WDL) X  Skin Color Appropriate for ethnicity  Musculoskeletal  Musculoskeletal (WDL) X  Generalized Weakness Yes  Assistive Device None  GU Assessment  Genitourinary (WDL) X  Genitourinary Symptoms  (HD)  Psychosocial  Psychosocial (WDL) WDL

## 2019-02-06 NOTE — Progress Notes (Signed)
Can you order PRN pain meds. The patient is complaining of a headache 5/10. Do you want to order tylenol or fioricet.

## 2019-02-06 NOTE — Anesthesia Preprocedure Evaluation (Signed)
Anesthesia Evaluation  Patient identified by MRN, date of birth, ID band Patient awake    Reviewed: Allergy & Precautions, H&P , NPO status , Patient's Chart, lab work & pertinent test results, reviewed documented beta blocker date and time   Airway Mallampati: II   Neck ROM: full    Dental  (+) Poor Dentition   Pulmonary neg pulmonary ROS, former smoker,    Pulmonary exam normal        Cardiovascular Exercise Tolerance: Good hypertension, On Medications negative cardio ROS Normal cardiovascular exam Rhythm:regular Rate:Normal     Neuro/Psych CVA, Residual Symptoms negative neurological ROS  negative psych ROS   GI/Hepatic negative GI ROS, Neg liver ROS,   Endo/Other  negative endocrine ROS  Renal/GU ESRF and DialysisRenal diseasenegative Renal ROS  negative genitourinary   Musculoskeletal   Abdominal   Peds  Hematology negative hematology ROS (+) Blood dyscrasia, anemia ,   Anesthesia Other Findings Past Medical History: No date: Chronic kidney disease No date: Hypertension 2015: Stroke ([place])     Comment:  partial left sided weakness Past Surgical History: No date: NO PAST SURGERIES BMI    Body Mass Index:  24.02 kg/m     Reproductive/Obstetrics negative OB ROS                             Anesthesia Physical Anesthesia Plan  ASA: III and emergent  Anesthesia Plan: General   Post-op Pain Management:    Induction:   PONV Risk Score and Plan:   Airway Management Planned:   Additional Equipment:   Intra-op Plan:   Post-operative Plan:   Informed Consent: I have reviewed the patients History and Physical, chart, labs and discussed the procedure including the risks, benefits and alternatives for the proposed anesthesia with the patient or authorized representative who has indicated his/her understanding and acceptance.     Dental Advisory Given  Plan Discussed with:  CRNA  Anesthesia Plan Comments:         Anesthesia Quick Evaluation

## 2019-02-06 NOTE — Anesthesia Post-op Follow-up Note (Signed)
Anesthesia QCDR form completed.        

## 2019-02-06 NOTE — Progress Notes (Signed)
HD tx start    02/06/19 0925  Vital Signs  Pulse Rate 86  Pulse Rate Source Monitor  Resp 13  BP 110/70  BP Location Left Arm  BP Method Automatic  Patient Position (if appropriate) Lying  Oxygen Therapy  SpO2 100 %  O2 Device Room Air  During Hemodialysis Assessment  Blood Flow Rate (mL/min) 400 mL/min  Arterial Pressure (mmHg) -120 mmHg  Venous Pressure (mmHg) 130 mmHg  Transmembrane Pressure (mmHg) 40 mmHg  Ultrafiltration Rate (mL/min) 430 mL/min  Dialysate Flow Rate (mL/min) 600 ml/min  Conductivity: Machine  14.1  HD Safety Checks Performed Yes  Dialysis Fluid Bolus Normal Saline  Bolus Amount (mL) 250 mL  Intra-Hemodialysis Comments Tx initiated

## 2019-02-06 NOTE — Care Management (Signed)
Amanda Morris dialysis liaison notified of admission.    

## 2019-02-06 NOTE — Progress Notes (Addendum)
Blood glucose rechecked after 12.5g of D50 given. Blood sugar rechecked after blood glucose had been obtained and was 69. Most current blood glucose level 159. Blood glucose not being processed by blood glucose machine.

## 2019-02-06 NOTE — Progress Notes (Signed)
MD messaged: The EGD was performed. They reported that they did not find any source of active bleeding. They did find ulcers yet they are not bleeding. The patient wanted to know if he would be able to have a diet when he gets back to the unit.

## 2019-02-06 NOTE — Progress Notes (Signed)
[facility] [place] NAME: Jose Vance    MR#:  [id number]  DATE OF BIRTH:  June 17, 1986  SUBJECTIVE: Admitted for symptomatic anemia with GI bleed, has been having black stool since Saturday came in because of dizziness and found to have severe anemia with hemoglobin 5.6.  Received 2 units of transfusion, hemoglobin 7.3 after 2 units of blood transfusion, seen during dialysis.  Denies abdominal pain.  Denies any use of NSAID's as well.  CHIEF COMPLAINT:   Chief Complaint  Patient presents with  . Weakness    REVIEW OF SYSTEMS:   ROS CONSTITUTIONAL: Fatigue, generalized weakness. EYES: No blurred or double vision.  EARS, NOSE, AND THROAT: No tinnitus or ear pain.  RESPIRATORY: No cough, shortness of breath, wheezing or hemoptysis.  CARDIOVASCULAR: No chest pain, orthopnea, edema.  GASTROINTESTINAL: No nausea, vomiting, diarrhea or abdominal pain.  GENITOURINARY: No dysuria, hematuria.  ENDOCRINE: No polyuria, nocturia,  HEMATOLOGY: anemic.  SKIN: No rash or lesion. MUSCULOSKELETAL: No joint pain or arthritis.   NEUROLOGIC: No tingling, numbness, weakness.  PSYCHIATRY: No anxiety or depression.   DRUG ALLERGIES:  No Known Allergies  VITALS:  Blood pressure 112/69, pulse 68, temperature 98.4 F (36.9 C), temperature source Oral, resp. rate 13, height 5\' 6"  (1.676 m), weight 67.2 kg, SpO2 100 %.  PHYSICAL EXAMINATION:  GENERAL:  33 y.o.-year-old patient lying in the bed with no acute distress.  Patient appears very thin. EYES: Pupils equal, round, reactive to light and accommodation. No scleral icterus. Extraocular muscles intact.  HEENT: Head atraumatic, normocephalic. Oropharynx and nasopharynx clear.  NECK:  Supple, no jugular venous distention. No thyroid enlargement, no tenderness.  LUNGS: Normal breath sounds bilaterally, no wheezing, rales,rhonchi or crepitation. No use of accessory muscles of respiration.   CARDIOVASCULAR: S1, S2 normal. No murmurs, rubs, or gallops.  ABDOMEN: Soft, nontender, nondistended. Bowel sounds present. No organomegaly or mass.  EXTREMITIES: No pedal edema, cyanosis, or clubbing.  NEUROLOGIC: Cranial nerves II through XII are intact. Muscle strength 5/5 in all extremities. Sensation intact. Gait not checked.  PSYCHIATRIC: The patient is alert and oriented x 3.  SKIN: No obvious rash, lesion, or ulcer.    LABORATORY PANEL:   CBC Recent Labs  Lab 02/06/19 0218 02/06/19 1054  WBC 9.3  --   HGB 5.9* 7.3*  HCT 17.8*  --   PLT 144*  --    ------------------------------------------------------------------------------------------------------------------  Chemistries  Recent Labs  Lab 02/06/19 0218  NA 137  K 3.9  CL 98  CO2 23  GLUCOSE 94  BUN 133*  CREATININE 14.01*  CALCIUM 8.3*   ------------------------------------------------------------------------------------------------------------------  Cardiac Enzymes No results for input(s): TROPONINI in the last 168 hours. ------------------------------------------------------------------------------------------------------------------  RADIOLOGY:  Dg Chest 2 View  Result Date: 02/05/2019 CLINICAL DATA:  Weakness.  Shortness of breath. EXAM: CHEST - 2 VIEW COMPARISON:  None. FINDINGS: The heart size and mediastinal contours are within normal limits. Both lungs are clear. The visualized skeletal structures are unremarkable. IMPRESSION: No active cardiopulmonary disease. Electronically Signed   By: Dorise Bullion III M.D   On: 02/05/2019 17:38    EKG:   Orders placed or performed during the hospital encounter of 02/05/19  . EKG 12-Lead  . EKG 12-Lead    ASSESSMENT AND PLAN:   33 year old male patient with ESRD on hemodialysis comes in with dizziness, found to have severe anemia. 1.  GI bleed with melena for 2 days, on Protonix infusion, patient scheduled for EGD  this afternoon. 2.  Acute blood  loss anemia, hemoglobin 5.4 on admission, status post 2 units of blood transfusion, hemoglobin 7.3, transfuse 1 more unit of packed RBC, for EGD this afternoon. 3.  ESRD on hemodialysis, nephrology is following.  #4 anemia of chronic kidney disease at baseline, gets EPO with hemodialysis.   All the records are reviewed and case discussed with Care Management/Social Workerr. Management plans discussed with the patient, family and they are in agreement.  CODE STATUS: Full code  TOTAL TIME TAKING CARE OF THIS PATIENT: 38 minutes.   POSSIBLE D/C IN 1-2 DAYS, DEPENDING ON CLINICAL CONDITION.   Epifanio Lesches M.D on 02/06/2019 at 11:56 AM  Between 7am to 6pm - Pager - [phone]  After 6pm go to [email or web address] - password [email or web address] [facility]  Office  [phone]  CC: Primary care physician; Patient, No Pcp Per   Note: This dictation was prepared with Dragon dictation along with smaller phrase technology. Any transcriptional errors that result from this process are unintentional.

## 2019-02-06 NOTE — Progress Notes (Signed)
HD tx end    02/06/19 1301  Vital Signs  Pulse Rate (!) 123  Pulse Rate Source Monitor  Resp 13  BP (!) 145/58  BP Location Left Arm  BP Method Automatic  Patient Position (if appropriate) Lying  Oxygen Therapy  SpO2 97 %  O2 Device Room Air  During Hemodialysis Assessment  Dialysis Fluid Bolus Normal Saline  Bolus Amount (mL) 250 mL  Intra-Hemodialysis Comments Tx completed

## 2019-02-06 NOTE — Transfer of Care (Signed)
Immediate Anesthesia Transfer of Care Note  Patient: Jose Vance  Procedure(s) Performed: ESOPHAGOGASTRODUODENOSCOPY (EGD) (Left )  Patient Location: PACU  Anesthesia Type:General  Level of Consciousness: sedated  Airway & Oxygen Therapy: Patient Spontanous Breathing and Patient connected to nasal cannula oxygen  Post-op Assessment: Report given to RN  Post vital signs: Reviewed  Last Vitals:  Vitals Value Taken Time  BP 101/52 02/06/2019  4:46 PM  Temp    Pulse 106 02/06/2019  4:46 PM  Resp 14 02/06/2019  4:46 PM  SpO2 100 % 02/06/2019  4:46 PM  Vitals shown include unvalidated device data.  Last Pain:  Vitals:   02/06/19 1645  TempSrc:   PainSc: Asleep         Complications: No apparent anesthesia complications

## 2019-02-06 NOTE — Consult Note (Signed)
Date: 02/06/2019                  Patient Name:  Jose Vance  MRN: [id number]  DOB: 05/20/1986  Age / Sex: 33 y.o., male         PCP: Patient, No Pcp Per                 Service Requesting Consult: IM/ Epifanio Lesches, MD                 Reason for Consult: ESRD            History of Present Illness: Patient is a 33 y.o.African [place] male with medical problems of HTN, [place] 2015, mild residual left sided weakness, who was admitted to [facility] on 02/05/2019 for evaluation of dizziness.  Patient reports that he had his last dialysis as usual on Friday but started feeling dizzy since then.  On Saturday he was not feeling well.  He reports seeing blood in the stool and vomited blood.  Then he decided to come to the emergency room for evaluation on Sunday Upon presentation, his hemoglobin was 5.6.  He is undergoing GI evaluation.  He is getting blood transfusion.   Medications: Outpatient medications: Medications Prior to Admission  Medication Sig Dispense Refill Last Dose  . calcium acetate (PHOSLO) 667 MG capsule Take 1,334-2,001 mg by mouth 5 (five) times daily.   02/04/2019 at Unknown time  . lisinopril (PRINIVIL,ZESTRIL) 20 MG tablet Take 20 mg by mouth daily.   02/05/2019 at 0930    Current medications: Current Facility-Administered Medications  Medication Dose Route Frequency Provider Last Rate Last Dose  . 0.9 %  sodium chloride infusion  10 mL/hr Intravenous Once Nance Pear, MD   Stopped at 02/05/19 2000  . docusate sodium (COLACE) capsule 100 mg  100 mg Oral BID PRN Vaughan Basta, MD      . pantoprazole (PROTONIX) 80 mg in sodium chloride 0.9 % 250 mL (0.32 mg/mL) infusion  8 mg/hr Intravenous Continuous Nance Pear, MD 25 mL/hr at 02/06/19 0520 8 mg/hr at 02/06/19 0520  . pantoprazole (PROTONIX) injection 40 mg  40 mg Intravenous Q12H Nance Pear, MD   40 mg at 02/05/19 1753      Allergies: No Known Allergies    Past Medical History: Past  Medical History:  Diagnosis Date  . Chronic kidney disease   . Hypertension   . Stroke [facility]) 2015   partial left sided weakness     Past Surgical History: Past Surgical History:  Procedure Laterality Date  . NO PAST SURGERIES       Family History: Family History  Problem Relation Age of Onset  . Hypertension Mother      Social History: Social History   Socioeconomic History  . Marital status: Unknown    Spouse name: Not on file  . Number of children: Not on file  . Years of education: Not on file  . Highest education level: Not on file  Occupational History  . Not on file  Social Needs  . Financial resource strain: Not on file  . Food insecurity:    Worry: Not on file    Inability: Not on file  . Transportation needs:    Medical: Not on file    Non-medical: Not on file  Tobacco Use  . Smoking status: Current Every Day Smoker    Packs/day: 0.50  . Smokeless tobacco: Never Used  Substance and Sexual Activity  . Alcohol  use: Not on file  . Drug use: Not on file  . Sexual activity: Not on file  Lifestyle  . Physical activity:    Days per week: Not on file    Minutes per session: Not on file  . Stress: Not on file  Relationships  . Social connections:    Talks on phone: Not on file    Gets together: Not on file    Attends religious service: Not on file    Active member of club or organization: Not on file    Attends meetings of clubs or organizations: Not on file    Relationship status: Not on file  . Intimate partner violence:    Fear of current or ex partner: Not on file    Emotionally abused: Not on file    Physically abused: Not on file    Forced sexual activity: Not on file  Other Topics Concern  . Not on file  Social History Narrative  . Not on file     Review of Systems: Gen: Lost more than 100 pounds since her stroke in 2015 HEENT: Denies any vision or hearing problems CV: No chest pain or shortness of breath Resp: No cough or sputum  production GI: Reports vomiting and blood in stool. GU : No complaints MS: No complaints Derm:  No complaints Psych: No complaints Heme: Anemia.  Getting blood transfusion Neuro: No complaints Endocrine.  No complaints  Vital Signs: Blood pressure 104/64, pulse 91, temperature 98.3 F (36.8 C), temperature source Oral, resp. rate 18, height 5\' 6"  (1.676 m), weight 63.8 kg, SpO2 100 %.   Intake/Output Summary (Last 24 hours) at 02/06/2019 0828 Last data filed at 02/06/2019 0443 Gross per 24 hour  Intake 1293.69 ml  Output 100 ml  Net 1193.69 ml    Weight trends: [employer]   02/05/19 1509  Weight: 63.8 kg    Physical Exam: General:  Chronically ill-appearing, laying in the bed  HEENT  anicteric, moist oral mucous membranes  Neck:  Soft  Lungs:  Normal breathing effort, clear  Heart::  Regular, no rub  Abdomen:  Soft, nontender  Extremities:  No edema  Neurologic:  Alert, oriented  Skin:  No acute rashes  Access:  Right arm AV fistula          Lab results: Basic Metabolic Panel: Recent Labs  Lab 02/05/19 1514 02/06/19 0218  NA 133* 137  K 3.6 3.9  CL 90* 98  CO2 26 23  GLUCOSE 99 94  BUN 126* 133*  CREATININE 12.84* 14.01*  CALCIUM 8.5* 8.3*    Liver Function Tests: No results for input(s): AST, ALT, ALKPHOS, BILITOT, PROT, ALBUMIN in the last 168 hours. No results for input(s): LIPASE, AMYLASE in the last 168 hours. No results for input(s): AMMONIA in the last 168 hours.  CBC: Recent Labs  Lab 02/05/19 1514 02/05/19 1715 02/06/19 0218  WBC 10.6*  --  9.3  NEUTROABS 7.9*  --   --   HGB 5.6* 5.4* 5.9*  HCT 16.9*  --  17.8*  MCV 96.0  --  93.2  PLT 197  --  144*    Cardiac Enzymes: No results for input(s): CKTOTAL, TROPONINI in the last 168 hours.  BNP: Invalid input(s): POCBNP  CBG: No results for input(s): GLUCAP in the last 168 hours.  Microbiology: Recent Results (from the past 720 hour(s))  MRSA PCR Screening     Status:  None   Collection Time: 02/05/19  9:37 PM  Result Value Ref Range Status   MRSA by PCR NEGATIVE NEGATIVE Final    Comment:        The GeneXpert MRSA Assay (FDA approved for NASAL specimens only), is one component of a comprehensive MRSA colonization surveillance program. It is not intended to diagnose MRSA infection nor to guide or monitor treatment for MRSA infections. Performed at [facility], [address] [place], [place] [address]      Coagulation Studies: No results for input(s): LABPROT, INR in the last 72 hours.  Urinalysis: No results for input(s): COLORURINE, LABSPEC, PHURINE, GLUCOSEU, HGBUR, BILIRUBINUR, KETONESUR, PROTEINUR, UROBILINOGEN, NITRITE, LEUKOCYTESUR in the last 72 hours.  Invalid input(s): APPERANCEUR      Imaging: Dg Chest 2 View  Result Date: 02/05/2019 CLINICAL DATA:  Weakness.  Shortness of breath. EXAM: CHEST - 2 VIEW COMPARISON:  None. FINDINGS: The heart size and mediastinal contours are within normal limits. Both lungs are clear. The visualized skeletal structures are unremarkable. IMPRESSION: No active cardiopulmonary disease. Electronically Signed   By: Dorise Bullion III M.D   On: 02/05/2019 17:38      Assessment & Plan: Pt is a 33 y.o. African-American  male with end-stage renal disease, hypertension, [place] 2015 with mild left-sided residual weakness, was admitted on 02/05/2019 with GI bleed.   [facility]/MWF/[facility]  End-stage renal disease -Plan for dialysis today.  Maintain MWF schedule  Anemia of chronic kidney disease and blood loss -Blood transfusion today -Plan for EGD later today -Continue EPO with hemodialysis  Secondary hyperparathyroidism -Hold binders until GI issues are resolved       LOS: [place] 2/17/20208:28 AM  [facility] [place], [place]  Note: This note was prepared with Dragon dictation. Any transcription errors are unintentional

## 2019-02-06 NOTE — Progress Notes (Signed)
Pre HD assessment    02/06/19 0913  Vital Signs  Temp 98.4 F (36.9 C)  Temp Source Oral  Pulse Rate 86  Pulse Rate Source Monitor  Resp 17  BP 118/75  BP Location Left Arm  BP Method Automatic  Patient Position (if appropriate) Lying  Oxygen Therapy  SpO2 100 %  O2 Device Room Air  Pain Assessment  Pain Scale 0-10  Pain Score 0  Dialysis Weight  Weight 67.2 kg  Type of Weight Pre-Dialysis  Time-Out for Hemodialysis  What Procedure? HD  Pt Identifiers(min of two) First/Last Name;MRN/Account#  Correct Site? Yes  Correct Side? Yes  Correct Procedure? Yes  Consents Verified? Yes  Rad Studies Available? N/A  Safety Precautions Reviewed? Yes  Engineer, civil (consulting) Number  ([id number])  Station Number 3  UF/Alarm Test Passed  Conductivity: Meter 13.6  Conductivity: Machine  13.9  pH 7.2  Reverse Osmosis main  Normal Saline Lot Number [id number]  Dialyzer Lot Number [id number]  Disposable Set Lot Number [id number]  Machine Temperature 98.6 F (37 C)  Musician and Audible Yes  Blood Lines Intact and Secured Yes  Pre Treatment Patient Checks  Vascular access used during treatment Fistula  Hepatitis B Surface Antigen Results Negative  Date Hepatitis B Surface Antigen Drawn 12/16/18  Hepatitis B Surface Antibody 354 (>10)  Date Hepatitis B Surface Antibody Drawn 12/16/18  Hemodialysis Consent Verified Yes  Hemodialysis Standing Orders Initiated Yes  ECG (Telemetry) Monitor On Yes  Prime Ordered Normal Saline  Length of  DialysisTreatment -hour(s) 3.5 Hour(s)  Dialyzer Elisio 17H NR  Dialysate 2K, 2.25 Ca  Dialysis Anticoagulant None  Dialysate Flow Ordered 600  Blood Flow Rate Ordered 400 mL/min  Ultrafiltration Goal 1 Liters  Pre Treatment Labs Other (Comment) (H&H)  Dialysis Blood Pressure Support Ordered Normal Saline  Education / Care Plan  Dialysis Education Provided Yes  Documented Education in Care Plan Yes

## 2019-02-06 NOTE — Progress Notes (Signed)
Post HD assessment. Pt tolerated tx well without c/o or complication. Net UF 528, goal met.    02/06/19 1308  Vital Signs  Temp 98.7 F (37.1 C)  Temp Source Oral  Pulse Rate 94  Pulse Rate Source Monitor  Resp 14  BP 118/78  BP Location Left Arm  BP Method Automatic  Patient Position (if appropriate) Lying  Oxygen Therapy  SpO2 100 %  O2 Device Room Air  Dialysis Weight  Weight 67.5 kg  Type of Weight Post-Dialysis  Post-Hemodialysis Assessment  Rinseback Volume (mL) 250 mL  KECN 80.1 V  Dialyzer Clearance Lightly streaked  Duration of HD Treatment -hour(s) 3.5 hour(s)  Hemodialysis Intake (mL) 500 mL  UF Total -Machine (mL) 1028 mL  Net UF (mL) 528 mL  Tolerated HD Treatment Yes  AVG/AVF Arterial Site Held (minutes) 10 minutes  AVG/AVF Venous Site Held (minutes) 10 minutes  Education / Care Plan  Dialysis Education Provided Yes  Documented Education in Care Plan Yes

## 2019-02-06 NOTE — Progress Notes (Signed)
MD messaged The patient wants to know if we can test him for the flu since he said his nose started running today.

## 2019-02-06 NOTE — Progress Notes (Signed)
Pre HD assessment    02/06/19 0914  Neurological  Level of Consciousness Alert  Orientation Level Oriented X4  Respiratory  Respiratory Pattern Regular;Unlabored  Chest Assessment Chest expansion symmetrical  Cardiac  Pulse Regular  ECG Monitor Yes  Cardiac Rhythm NSR;ST  Vascular  R Radial Pulse +2  L Radial Pulse +2  Integumentary  Integumentary (WDL) X  Skin Color Appropriate for ethnicity  Musculoskeletal  Musculoskeletal (WDL) X  Generalized Weakness Yes  Assistive Device None  GU Assessment  Genitourinary (WDL) X  Genitourinary Symptoms  (HD)  Psychosocial  Psychosocial (WDL) WDL

## 2019-02-06 NOTE — Progress Notes (Signed)
Spoke with Dr. Graylon Gunning about hemoglobin of 5.9 after receiving 1 unit of packed red blood cells. New  Orders for another unit of packed red blood cells to be given. Jose Vance

## 2019-02-06 NOTE — Op Note (Addendum)
[facility]astroenterology Patient Name: Jose Vance Procedure Date: 02/06/2019 4:34 PM MRN: [id number] Account #: [id number] Date of Birth: Oct 24, 1986 Admit Type: Inpatient Age: 33 Room: [facility] ENDO ROOM 2 Gender: Male Note Status: Finalized Procedure:            Upper GI endoscopy Indications:          Melena Providers:            Gershom Brobeck B. Bonna Gains MD, MD Medicines:            Monitored Anesthesia Care Complications:        No immediate complications. Procedure:            Pre-Anesthesia Assessment:                       - The risks and benefits of the procedure and the                        sedation options and risks were discussed with the                        patient. All questions were answered and informed                        consent was obtained.                       - Patient identification and proposed procedure were                        verified prior to the procedure.                       - ASA Grade Assessment: III - A patient with severe                        systemic disease.                       After obtaining informed consent, the endoscope was                        passed under direct vision. Throughout the procedure,                        the patient's blood pressure, pulse, and oxygen                        saturations were monitored continuously. The Endoscope                        was introduced through the mouth, and advanced to the                        second part of duodenum. The upper GI endoscopy was                        accomplished with ease. The patient tolerated the                        procedure well. Findings:      Scattered islands of  salmon-colored mucosa were present. No other       visible abnormalities were present. The maximum longitudinal extent of       these esophageal mucosal changes was 1 cm in length. Biopsies were not       done due to recent GI bleed on this admission.      One  non-bleeding superficial gastric ulcer with a clean ulcer base       (Forrest Class III) was found in the gastric antrum. The lesion was 5 mm       in largest dimension.      The exam of the stomach was otherwise normal.      One non-bleeding cratered duodenal ulcer with a clean ulcer base       (Forrest Class III) was found in the duodenal bulb. The lesion was 7 mm       in largest dimension.      Patchy mildly erythematous mucosa without active bleeding was found in       the duodenal bulb.      The exam of the duodenum was otherwise normal. Impression:           - Salmon-colored mucosa suspicious for short-segment                        Barrett's esophagus.                       - Non-bleeding gastric ulcer with a clean ulcer base                        (Forrest Class III).                       - One non-bleeding duodenal ulcer with a clean ulcer                        base (Forrest Class III).                       - Erythematous duodenopathy.                       - No specimens collected. Recommendation:       - Follow an antireflux regimen.                       - Use Protonix (pantoprazole) 40 mg PO BID for 4 weeks.                       - Continue Serial CBCs and transfuse PRN                       - Avoid NSAIDs except Aspirin if medically indicated by                        PCP                       - Perform an H. pylori serology today.                       - The findings and recommendations were discussed with  the patient.                       - Return to my office in 2 weeks.                       - Pt will need repeat EGD in 3-4 months for biopsies                        for Barrett's mucosa. Procedure Code(s):    --- Professional ---                       [phone], Esophagogastroduodenoscopy, flexible, transoral;                        diagnostic, including collection of specimen(s) by                        brushing or washing, when performed  (separate procedure) Diagnosis Code(s):    --- Professional ---                       K22.8, Other specified diseases of esophagus                       K25.9, Gastric ulcer, unspecified as acute or chronic,                        without hemorrhage or perforation                       K26.9, Duodenal ulcer, unspecified as acute or chronic,                        without hemorrhage or perforation                       K92.1, Melena (includes Hematochezia) CPT copyright 2018 American Medical Association. All rights reserved. The codes documented in this report are preliminary and upon coder review may  be revised to meet current compliance requirements.  Vonda Antigua, MD Margretta Sidle B. Bonna Gains MD, MD 02/06/2019 5:39:30 PM This report has been signed electronically. Number of Addenda: 0 Note Initiated On: 02/06/2019 4:34 PM Estimated Blood Loss: Estimated blood loss: none.      [facility]

## 2019-02-06 NOTE — Consult Note (Signed)
Vonda Antigua, MD [address] [place], [place], [place], [address] [address], [place], [place], [place], [address] Phone: [phone]  Fax: [phone]  Consultation  Referring Provider:     Dr. Vianne Bulls Primary Care Physician:  Patient, No Pcp Per Reason for Consultation:     Melena, anemia  Date of Admission:  02/05/2019 Date of Consultation:  02/06/2019         HPI:   Jose Vance is a 33 y.o. male presented to the hospital with dark color emesis, and found to be anemic.  Patient with history of ESRD on hemodialysis.  No prior history of GI bleed or endoscopy.  Has had recent NSAID use.  Patient is undergoing hemodialysis today.  Hemoglobin low around 5 on admission, now improved to 7.3 after transfusion.  The patient denies abdominal or flank pain, anorexia, nausea or vomiting, dysphagia, or weight loss.  No family history of colon cancer.  No prior colonoscopy.  Past Medical History:  Diagnosis Date  . Chronic kidney disease   . Hypertension   . Stroke [facility]) 2015   partial left sided weakness    Past Surgical History:  Procedure Laterality Date  . NO PAST SURGERIES      Prior to Admission medications   Medication Sig Start Date End Date Taking? Authorizing Provider  calcium acetate (PHOSLO) 667 MG capsule Take 1,334-2,001 mg by mouth 5 (five) times daily. 08/30/18  Yes [provider]  lisinopril (PRINIVIL,ZESTRIL) 20 MG tablet Take 20 mg by mouth daily. 08/02/18  Yes [provider]    Family History  Problem Relation Age of Onset  . Hypertension Mother      Social History   Tobacco Use  . Smoking status: Current Every Day Smoker    Packs/day: 0.50  . Smokeless tobacco: Never Used  Substance Use Topics  . Alcohol use: Not on file  . Drug use: Not on file    Allergies as of 02/05/2019  . (No Known Allergies)    Review of Systems:    All systems reviewed and negative except where noted in HPI.   Physical Exam:    Vital signs in last 24 hours: Vitals:   02/06/19 1045 02/06/19 1100 02/06/19 1115 02/06/19 1130  BP: 110/62 118/69 100/66 101/73  Pulse: 76 67 66 79  Resp: 11 13 11 15   Temp:      TempSrc:      SpO2: 100% 100% 100% 100%  Weight:      Height:       Last BM Date: 02/05/19 General:   Pleasant, cooperative in NAD Head:  Normocephalic and atraumatic. Eyes:   No icterus.   Conjunctiva pink. PERRLA. Ears:  Normal auditory acuity. Neck:  Supple; no masses or thyroidomegaly Lungs: Respirations even and unlabored. Lungs clear to auscultation bilaterally.   No wheezes, crackles, or rhonchi.  Abdomen:  Soft, nondistended, nontender. Normal bowel sounds. No appreciable masses or hepatomegaly.  No rebound or guarding.  Neurologic:  Alert and oriented x3;  grossly normal neurologically. Skin:  Intact without significant lesions or rashes. Cervical Nodes:  No significant cervical adenopathy. Psych:  Alert and cooperative. Normal affect.  LAB RESULTS: Recent Labs    02/05/19 1514 02/05/19 1715 02/06/19 0218 02/06/19 1054  WBC 10.6*  --  9.3  --   HGB 5.6* 5.4* 5.9* 7.3*  HCT 16.9*  --  17.8*  --   PLT 197  --  144*  --    BMET Recent  Labs    02/05/19 1514 02/06/19 0218  NA 133* 137  K 3.6 3.9  CL 90* 98  CO2 26 23  GLUCOSE 99 94  BUN 126* 133*  CREATININE 12.84* 14.01*  CALCIUM 8.5* 8.3*   LFT No results for input(s): PROT, ALBUMIN, AST, ALT, ALKPHOS, BILITOT, BILIDIR, IBILI in the last 72 hours. PT/INR No results for input(s): LABPROT, INR in the last 72 hours.  STUDIES: Dg Chest 2 View  Result Date: 02/05/2019 CLINICAL DATA:  Weakness.  Shortness of breath. EXAM: CHEST - 2 VIEW COMPARISON:  None. FINDINGS: The heart size and mediastinal contours are within normal limits. Both lungs are clear. The visualized skeletal structures are unremarkable. IMPRESSION: No active cardiopulmonary disease. Electronically Signed   By: Dorise Bullion III M.D   On: 02/05/2019 17:38       Impression / Plan:   Jose Vance is a 33 y.o. y/o male with anemia  Given his recent NSAID use, peptic ulcer disease is high on the differential  PPI IV twice daily  Continue serial CBCs and transfuse PRN Avoid NSAIDs Maintain 2 large-bore IV lines Please page GI with any acute hemodynamic changes, or signs of active GI bleeding  Patient received his hemodialysis today and EGD is planned for after dialysis.  I have discussed alternative options, risks & benefits,  which include, but are not limited to, bleeding, infection, perforation,respiratory complication & drug reaction.  The patient agrees with this plan & written consent will be obtained.     Thank you for involving me in the care of this patient.      LOS: 1 day   Virgel Manifold, MD  02/06/2019, 11:40 AM

## 2019-02-07 ENCOUNTER — Encounter: Payer: Self-pay | Admitting: Gastroenterology

## 2019-02-07 LAB — BPAM RBC
BLOOD PRODUCT EXPIRATION DATE: 202003082359
Blood Product Expiration Date: 202003082359
Blood Product Expiration Date: 202003082359
ISSUE DATE / TIME: 202002161915
ISSUE DATE / TIME: 202002171844
ISSUE DATE / TIME: 202002170448
Unit Type and Rh: 5100
Unit Type and Rh: 5100
Unit Type and Rh: 5100

## 2019-02-07 LAB — TYPE AND SCREEN
ABO/RH(D): O POS
Antibody Screen: NEGATIVE
UNIT DIVISION: 0
UNIT DIVISION: 0
Unit division: 0

## 2019-02-07 LAB — BASIC METABOLIC PANEL
Anion gap: 6 (ref 5–15)
BUN: 45 mg/dL — ABNORMAL HIGH (ref 6–20)
CO2: 27 mmol/L (ref 22–32)
Calcium: 8 mg/dL — ABNORMAL LOW (ref 8.9–10.3)
Chloride: 105 mmol/L (ref 98–111)
Creatinine, Ser: 7.73 mg/dL — ABNORMAL HIGH (ref 0.61–1.24)
GFR calc Af Amer: 10 mL/min — ABNORMAL LOW (ref >60)
GFR calc non Af Amer: 8 mL/min — ABNORMAL LOW (ref >60)
Glucose, Bld: 107 mg/dL — ABNORMAL HIGH (ref 70–99)
Potassium: 4.1 mmol/L (ref 3.5–5.1)
Sodium: 138 mmol/L (ref 135–145)

## 2019-02-07 LAB — HEMOGLOBIN: Hemoglobin: 8 g/dL — ABNORMAL LOW (ref 13.0–17.0)

## 2019-02-07 LAB — HIV ANTIBODY (ROUTINE TESTING W REFLEX): HIV SCREEN 4TH GENERATION: NONREACTIVE

## 2019-02-07 MED ORDER — PANTOPRAZOLE SODIUM 40 MG PO TBEC: 40.0000 mg | DELAYED_RELEASE_TABLET | Freq: Two times a day (BID) | ORAL | 0 refills | Status: DC

## 2019-02-07 NOTE — Care Management (Signed)
Amanda Morris dialysis liaison notified of discharge  

## 2019-02-07 NOTE — Progress Notes (Signed)
Patient is stable for discharge, discharge instructions on the computer, continue PPIs twice a day for at least 4 months, follow-up with Dr. Bonna Gains  in2 weeks continue dialysis as scheduled.

## 2019-02-08 NOTE — Anesthesia Postprocedure Evaluation (Signed)
Anesthesia Post Note  Patient: Terryn Redner  Procedure(s) Performed: ESOPHAGOGASTRODUODENOSCOPY (EGD) (Left )  Patient location during evaluation: PACU Anesthesia Type: General Level of consciousness: awake and alert Pain management: pain level controlled Vital Signs Assessment: post-procedure vital signs reviewed and stable Respiratory status: spontaneous breathing, nonlabored ventilation, respiratory function stable and patient connected to nasal cannula oxygen Cardiovascular status: blood pressure returned to baseline and stable Postop Assessment: no apparent nausea or vomiting Anesthetic complications: no     Last Vitals:  Vitals:   02/06/19 2156 02/07/19 0529  BP: 140/89 121/71  Pulse: 87 78  Resp: 20 16  Temp: 37.3 C 36.9 C  SpO2: 96% 100%    Last Pain:  Vitals:   02/07/19 0730  TempSrc:   PainSc: 0-No pain                 Molli Barrows

## 2019-02-11 NOTE — Discharge Summary (Signed)
Jose Vance, is a 33 y.o. male  DOB 02-Jul-1986  MRN [id number].  Admission date:  02/05/2019  Admitting Physician  Vaughan Basta, MD  Discharge Date:  02/07/2019   Primary MD  Patient, No Pcp Per  Recommendations for primary care physician for things to follow:   Follow-up with Dr. Bonna Gains in 2 weeks. Follow-up with your dialysis as scheduled.   Admission Diagnosis  Weakness [R53.1] Gastrointestinal hemorrhage, unspecified gastrointestinal hemorrhage type [K92.2] Anemia, unspecified type [D64.9]   Discharge Diagnosis  Weakness [R53.1] Gastrointestinal hemorrhage, unspecified gastrointestinal hemorrhage type [K92.2] Anemia, unspecified type [D64.9]   Principal Problem:   Symptomatic anemia Active Problems:   GI bleed   Columnar-lined esophagus   Postpyloric ulcer   Melena      Past Medical History:  Diagnosis Date  . Chronic kidney disease   . Hypertension   . Stroke [facility]) 2015   partial left sided weakness    Past Surgical History:  Procedure Laterality Date  . ESOPHAGOGASTRODUODENOSCOPY Left 02/06/2019   Procedure: ESOPHAGOGASTRODUODENOSCOPY (EGD);  Surgeon: Virgel Manifold, MD;  Location: [facility] ENDOSCOPY;  Service: Endoscopy;  Laterality: Left;  . NO PAST SURGERIES         History of present illness and  Hospital Course:     Kindly see H&P for history of present illness and admission details, please review complete Labs, Consult reports and Test reports for all details in brief  HPI  from the history and physical done on the day of admission 33 year old male patient with history of ESRD on hemodialysis admitted because of black stools, generalized weakness with dizziness, found to have severe anemia with hemoglobin 5.6 on admission.   Hospital Course  #1 GI bleed, patient had  upper GI bleed with melena, admitted to medical service, received Protonix infusion, seen by gastroenterology, had endoscopy, EGD with gastroenterology which showed salmon-colored mucosa suspicious for short segment Barrett's esophagus, nonbleeding gastric ulcer with clean ulcer base, nonbleeding duodenal ulcer with clean ulcer base, erythematous duodenal apathy, gastroenterologist recommended using Protonix 40 mg twice daily for 4 weeks, avoid NSAID's except aspirin if needed medically, patient should follow-up with gastroenterology office in 2 weeks, needs repeat EGD in 3 to 4 months for biopsies for Barrett's mucosa, patient felt better and discharged home in stable condition advised to follow-up with Dr. Dan Humphreys in 2 weeks, advised him to continue Protonix 40 mg p.o. twice daily.  Gastroenterologist recommended performing H. pylori serology but patient wanted to go home and patient can see them as an outpatient. 2.  Acute blood loss anemia secondary to GI bleed, patient hemoglobin was 5.6 on admission, received 3 units of blood transfusion, hemoglobin count improved to 8.  It was 5.6 on admission.,  #3 .ESRD on hemodialysis, seen by nephrology on this admission.  Patient is on Monday, Wednesday, Friday at [facility] in [place]. 4.  Anemia of chronic kidney disease and blood loss anemia, received blood transfusion, continue EPO with hemodialysis.   Discharge Condition: Stable   Follow UP  Follow-up Information    Virgel Manifold, MD. Go on 02/22/2019.   Specialty:  Gastroenterology Why:  @3pm  for hospital follow up Contact information: [place] [address] [phone]             Discharge Instructions  and  Discharge Medications      Allergies as of 02/07/2019   No Known Allergies     Medication List    TAKE these medications   calcium acetate  667 MG capsule Commonly known as:  PHOSLO Take 1,334-2,001 mg by mouth 5 (five) times daily.   lisinopril 20  MG tablet Commonly known as:  PRINIVIL,ZESTRIL Take 20 mg by mouth daily.   pantoprazole 40 MG tablet Commonly known as:  PROTONIX Take 1 tablet (40 mg total) by mouth 2 (two) times daily.         Diet and Activity recommendation: See Discharge Instructions above   Consults obtained -gastroenterology, nephrology   Major procedures and Radiology Reports - PLEASE review detailed and final reports for all details, in brief -      Dg Chest 2 View  Result Date: 02/05/2019 CLINICAL DATA:  Weakness.  Shortness of breath. EXAM: CHEST - 2 VIEW COMPARISON:  None. FINDINGS: The heart size and mediastinal contours are within normal limits. Both lungs are clear. The visualized skeletal structures are unremarkable. IMPRESSION: No active cardiopulmonary disease. Electronically Signed   By: Dorise Bullion III M.D   On: 02/05/2019 17:38    Micro Results     Recent Results (from the past 240 hour(s))  MRSA PCR Screening     Status: None   Collection Time: 02/05/19  9:37 PM  Result Value Ref Range Status   MRSA by PCR NEGATIVE NEGATIVE Final    Comment:        The GeneXpert MRSA Assay (FDA approved for NASAL specimens only), is one component of a comprehensive MRSA colonization surveillance program. It is not intended to diagnose MRSA infection nor to guide or monitor treatment for MRSA infections. Performed at [facility], [address] [place], [place] [address]        Today   Subjective:   Jose Vance today has no headache,no chest abdominal pain,no new weakness tingling or numbness, feels much better wants to go home today.   Objective:   Blood pressure 121/71, pulse 78, temperature 98.4 F (36.9 C), temperature source Oral, resp. rate 16, height 5\' 6"  (1.676 m), weight 67.5 kg, SpO2 100 %.  No intake or output data in the 24 hours ending 02/11/19 1105  Exam Awake Alert, Oriented x 3, No new F.N deficits, Normal affect [place].AT,PERRAL Supple  Neck,No JVD, No cervical lymphadenopathy appriciated.  Symmetrical Chest wall movement, Good air movement bilaterally, CTAB RRR,No Gallops,Rubs or new Murmurs, No Parasternal Heave +ve B.Sounds, Abd Soft, Non tender, No organomegaly appriciated, No rebound -guarding or rigidity. No Cyanosis, Clubbing or edema, No new Rash or bruise  Data Review   CBC w Diff:  Lab Results  Component Value Date   WBC 9.3 02/06/2019   HGB 8.0 (L) 02/07/2019   HCT 25.4 (L) 02/06/2019   PLT 144 (L) 02/06/2019   LYMPHOPCT 15 02/05/2019   MONOPCT 9 02/05/2019   EOSPCT 1 02/05/2019   BASOPCT 0 02/05/2019    CMP:  Lab Results  Component Value Date   NA 138 02/07/2019   K 4.1 02/07/2019   CL 105 02/07/2019   CO2 27 02/07/2019   BUN 45 (H) 02/07/2019   CREATININE 7.73 (H) 02/07/2019  .   Total Time in preparing paper work, data evaluation and todays exam - 35 minutes  Epifanio Lesches M.D on 02/07/2019 at 11:05 AM    Note: This dictation was prepared with Dragon dictation along with smaller phrase technology. Any transcriptional errors that result from this process are unintentional.

## 2019-02-22 ENCOUNTER — Ambulatory Visit: Payer: Medicare Other | Admitting: Gastroenterology

## 2019-02-22 ENCOUNTER — Encounter: Payer: Self-pay | Admitting: Gastroenterology

## 2022-01-28 ENCOUNTER — Inpatient Hospital Stay: Payer: Medicare HMO

## 2022-01-28 ENCOUNTER — Emergency Department: Payer: Medicare HMO

## 2022-01-28 ENCOUNTER — Observation Stay
Admission: EM | Admit: 2022-01-28 | Discharge: 2022-01-29 | Disposition: A | Discharge status: Home / Self Care | Payer: Medicare HMO | Attending: Internal Medicine | Admitting: Internal Medicine

## 2022-01-28 ENCOUNTER — Encounter: Payer: Self-pay | Admitting: Emergency Medicine

## 2022-01-28 ENCOUNTER — Other Ambulatory Visit: Payer: Self-pay

## 2022-01-28 DIAGNOSIS — R778 Other specified abnormalities of plasma proteins: Secondary | ICD-10-CM | POA: Insufficient documentation

## 2022-01-28 DIAGNOSIS — Z992 Dependence on renal dialysis: Secondary | ICD-10-CM | POA: Diagnosis not present

## 2022-01-28 DIAGNOSIS — N185 Chronic kidney disease, stage 5: Secondary | ICD-10-CM

## 2022-01-28 DIAGNOSIS — R0602 Shortness of breath: Secondary | ICD-10-CM

## 2022-01-28 DIAGNOSIS — E875 Hyperkalemia: Secondary | ICD-10-CM

## 2022-01-28 DIAGNOSIS — I160 Hypertensive urgency: Secondary | ICD-10-CM

## 2022-01-28 DIAGNOSIS — D631 Anemia in chronic kidney disease: Secondary | ICD-10-CM

## 2022-01-28 DIAGNOSIS — Z79899 Other long term (current) drug therapy: Secondary | ICD-10-CM | POA: Insufficient documentation

## 2022-01-28 DIAGNOSIS — I161 Hypertensive emergency: Principal | ICD-10-CM

## 2022-01-28 DIAGNOSIS — Z20822 Contact with and (suspected) exposure to covid-19: Secondary | ICD-10-CM | POA: Insufficient documentation

## 2022-01-28 DIAGNOSIS — N186 End stage renal disease: Secondary | ICD-10-CM | POA: Diagnosis not present

## 2022-01-28 DIAGNOSIS — R7989 Other specified abnormal findings of blood chemistry: Secondary | ICD-10-CM

## 2022-01-28 LAB — HIV ANTIBODY (ROUTINE TESTING W REFLEX): HIV Screen 4th Generation wRfx: NONREACTIVE

## 2022-01-28 LAB — COMPREHENSIVE METABOLIC PANEL
ALT: 234 U/L — ABNORMAL HIGH (ref 0–44)
AST: 149 U/L — ABNORMAL HIGH (ref 15–41)
Albumin: 3.6 g/dL (ref 3.5–5.0)
Alkaline Phosphatase: 66 U/L (ref 38–126)
Anion gap: 17 — ABNORMAL HIGH (ref 5–15)
BUN: 82 mg/dL — ABNORMAL HIGH (ref 6–20)
CO2: 20 mmol/L — ABNORMAL LOW (ref 22–32)
Calcium: 9.2 mg/dL (ref 8.9–10.3)
Chloride: 97 mmol/L — ABNORMAL LOW (ref 98–111)
Creatinine, Ser: 14.12 mg/dL — ABNORMAL HIGH (ref 0.61–1.24)
GFR, Estimated: 4 mL/min — ABNORMAL LOW (ref >60)
Glucose, Bld: 97 mg/dL (ref 70–99)
Potassium: 6 mmol/L — ABNORMAL HIGH (ref 3.5–5.1)
Sodium: 134 mmol/L — ABNORMAL LOW (ref 135–145)
Total Bilirubin: 1.7 mg/dL — ABNORMAL HIGH (ref 0.3–1.2)
Total Protein: 6.5 g/dL (ref 6.5–8.1)

## 2022-01-28 LAB — CBC WITH DIFFERENTIAL/PLATELET
Abs Immature Granulocytes: 0.03 10*3/uL (ref 0.00–0.07)
Basophils Absolute: 0 10*3/uL (ref 0.0–0.1)
Basophils Relative: 1 %
Eosinophils Absolute: 0.1 10*3/uL (ref 0.0–0.5)
Eosinophils Relative: 1 %
HCT: 28.5 % — ABNORMAL LOW (ref 39.0–52.0)
Hemoglobin: 9.4 g/dL — ABNORMAL LOW (ref 13.0–17.0)
Immature Granulocytes: 1 %
Lymphocytes Relative: 18 %
Lymphs Abs: 1.1 10*3/uL (ref 0.7–4.0)
MCH: 32 pg (ref 26.0–34.0)
MCHC: 33 g/dL (ref 30.0–36.0)
MCV: 96.9 fL (ref 80.0–100.0)
Monocytes Absolute: 0.5 10*3/uL (ref 0.1–1.0)
Monocytes Relative: 9 %
Neutro Abs: 4.3 10*3/uL (ref 1.7–7.7)
Neutrophils Relative %: 70 %
Platelets: 170 10*3/uL (ref 150–400)
RBC: 2.94 MIL/uL — ABNORMAL LOW (ref 4.22–5.81)
RDW: 15.9 % — ABNORMAL HIGH (ref 11.5–15.5)
WBC: 6 10*3/uL (ref 4.0–10.5)
nRBC: 0 % (ref 0.0–0.2)

## 2022-01-28 LAB — RESP PANEL BY RT-PCR (FLU A&B, COVID) ARPGX2
Influenza A by PCR: NEGATIVE
Influenza B by PCR: NEGATIVE
SARS Coronavirus 2 by RT PCR: NEGATIVE

## 2022-01-28 LAB — RENAL FUNCTION PANEL
Albumin: 3.2 g/dL — ABNORMAL LOW (ref 3.5–5.0)
Anion gap: 15 (ref 5–15)
BUN: 88 mg/dL — ABNORMAL HIGH (ref 6–20)
CO2: 21 mmol/L — ABNORMAL LOW (ref 22–32)
Calcium: 8.8 mg/dL — ABNORMAL LOW (ref 8.9–10.3)
Chloride: 97 mmol/L — ABNORMAL LOW (ref 98–111)
Creatinine, Ser: 14.48 mg/dL — ABNORMAL HIGH (ref 0.61–1.24)
GFR, Estimated: 4 mL/min — ABNORMAL LOW (ref >60)
Glucose, Bld: 56 mg/dL — ABNORMAL LOW (ref 70–99)
Phosphorus: 8.5 mg/dL — ABNORMAL HIGH (ref 2.5–4.6)
Potassium: 4.5 mmol/L (ref 3.5–5.1)
Sodium: 133 mmol/L — ABNORMAL LOW (ref 135–145)

## 2022-01-28 LAB — CBC
HCT: 35.9 % — ABNORMAL LOW (ref 39.0–52.0)
Hemoglobin: 11.4 g/dL — ABNORMAL LOW (ref 13.0–17.0)
MCH: 32.3 pg (ref 26.0–34.0)
MCHC: 31.8 g/dL (ref 30.0–36.0)
MCV: 101.7 fL — ABNORMAL HIGH (ref 80.0–100.0)
Platelets: 238 10*3/uL (ref 150–400)
RBC: 3.53 MIL/uL — ABNORMAL LOW (ref 4.22–5.81)
RDW: 16.1 % — ABNORMAL HIGH (ref 11.5–15.5)
WBC: 7.8 10*3/uL (ref 4.0–10.5)
nRBC: 0 % (ref 0.0–0.2)

## 2022-01-28 LAB — LIPASE, BLOOD: Lipase: 34 U/L (ref 11–51)

## 2022-01-28 LAB — HEPATITIS B SURFACE ANTIBODY,QUALITATIVE: Hep B S Ab: REACTIVE — AB

## 2022-01-28 LAB — TROPONIN I (HIGH SENSITIVITY)
Troponin I (High Sensitivity): 120 ng/L (ref <18)
Troponin I (High Sensitivity): 121 ng/L (ref <18)

## 2022-01-28 LAB — HEPATITIS B SURFACE ANTIGEN: Hepatitis B Surface Ag: NONREACTIVE

## 2022-01-28 MED ORDER — CALCIUM ACETATE (PHOS BINDER) 667 MG PO CAPS
2001.0000 mg | ORAL_CAPSULE | Freq: Three times a day (TID) | ORAL | Status: DC
  Administered 2022-01-29 (×3): 2001 mg via ORAL
  Filled 2022-01-28 (×5): qty 3

## 2022-01-28 MED ORDER — FUROSEMIDE 10 MG/ML IJ SOLN
100.0000 mg | Freq: Once | INTRAVENOUS | Status: AC
  Administered 2022-01-28: 100 mg via INTRAVENOUS
  Filled 2022-01-28: qty 10

## 2022-01-28 MED ORDER — PATIROMER SORBITEX CALCIUM 8.4 G PO PACK
8.4000 g | PACK | Freq: Every day | ORAL | Status: DC
  Filled 2022-01-28: qty 1

## 2022-01-28 MED ORDER — ACETAMINOPHEN 325 MG PO TABS: 650.0000 mg | ORAL_TABLET | Freq: Four times a day (QID) | ORAL | Status: DC | PRN

## 2022-01-28 MED ORDER — HYDRALAZINE HCL 20 MG/ML IJ SOLN
10.0000 mg | Freq: Once | INTRAMUSCULAR | Status: AC
  Administered 2022-01-28: 10 mg via INTRAVENOUS
  Filled 2022-01-28: qty 1

## 2022-01-28 MED ORDER — SODIUM BICARBONATE 8.4 % IV SOLN
50.0000 meq | Freq: Once | INTRAVENOUS | Status: AC
  Administered 2022-01-28: 50 meq via INTRAVENOUS
  Filled 2022-01-28: qty 50

## 2022-01-28 MED ORDER — HYDRALAZINE HCL 20 MG/ML IJ SOLN
10.0000 mg | Freq: Once | INTRAMUSCULAR | Status: AC
Start: 2022-01-28 — End: 2022-01-28
  Administered 2022-01-28: 10 mg via INTRAVENOUS
  Filled 2022-01-28: qty 1

## 2022-01-28 MED ORDER — AMLODIPINE BESYLATE 5 MG PO TABS
10.0000 mg | ORAL_TABLET | Freq: Every day | ORAL | Status: DC
  Administered 2022-01-28 – 2022-01-29 (×2): 10 mg via ORAL
  Filled 2022-01-28 (×2): qty 2

## 2022-01-28 MED ORDER — NITROGLYCERIN 2 % TD OINT
0.5000 [in_us] | TOPICAL_OINTMENT | Freq: Four times a day (QID) | TRANSDERMAL | Status: DC
  Administered 2022-01-28 – 2022-01-29 (×4): 0.5 [in_us] via TOPICAL
  Filled 2022-01-28 (×4): qty 1

## 2022-01-28 MED ORDER — PATIROMER SORBITEX CALCIUM 8.4 G PO PACK
8.4000 g | PACK | Freq: Once | ORAL | Status: AC
  Administered 2022-01-28: 8.4 g via ORAL

## 2022-01-28 MED ORDER — CLONIDINE HCL 0.1 MG PO TABS
0.2000 mg | ORAL_TABLET | Freq: Once | ORAL | Status: AC
  Administered 2022-01-28: 0.2 mg via ORAL
  Filled 2022-01-28: qty 2

## 2022-01-28 MED ORDER — ACETAMINOPHEN 650 MG RE SUPP: 650.0000 mg | Freq: Four times a day (QID) | RECTAL | Status: DC | PRN

## 2022-01-28 MED ORDER — DEXTROSE 50 % IV SOLN
1.0000 | Freq: Once | INTRAVENOUS | Status: AC
  Administered 2022-01-28: 50 mL via INTRAVENOUS
  Filled 2022-01-28: qty 50

## 2022-01-28 MED ORDER — ONDANSETRON HCL 4 MG PO TABS: 4.0000 mg | ORAL_TABLET | Freq: Four times a day (QID) | ORAL | Status: DC | PRN

## 2022-01-28 MED ORDER — ONDANSETRON HCL 4 MG/2ML IJ SOLN: 4.0000 mg | Freq: Four times a day (QID) | INTRAMUSCULAR | Status: DC | PRN

## 2022-01-28 MED ORDER — CARVEDILOL 6.25 MG PO TABS
6.2500 mg | ORAL_TABLET | Freq: Two times a day (BID) | ORAL | Status: DC
  Administered 2022-01-28 – 2022-01-29 (×3): 6.25 mg via ORAL
  Filled 2022-01-28 (×3): qty 1

## 2022-01-28 MED ORDER — CALCIUM GLUCONATE-NACL 1-0.675 GM/50ML-% IV SOLN
1.0000 g | Freq: Once | INTRAVENOUS | Status: AC
  Administered 2022-01-28: 1000 mg via INTRAVENOUS
  Filled 2022-01-28: qty 50

## 2022-01-28 MED ORDER — HEPARIN SODIUM (PORCINE) 5000 UNIT/ML IJ SOLN
5000.0000 [IU] | Freq: Three times a day (TID) | INTRAMUSCULAR | Status: DC
  Administered 2022-01-28 – 2022-01-29 (×4): 5000 [IU] via SUBCUTANEOUS
  Filled 2022-01-28 (×3): qty 1

## 2022-01-28 MED ORDER — INSULIN ASPART 100 UNIT/ML IJ SOLN
6.0000 [IU] | Freq: Once | INTRAMUSCULAR | Status: AC
  Administered 2022-01-28: 6 [IU] via SUBCUTANEOUS
  Filled 2022-01-28: qty 1

## 2022-01-28 NOTE — ED Provider Notes (Signed)
[facility] Provider Note    Event Date/Time   First MD Initiated Contact with Patient 01/28/22 0143     (approximate)   History   Shortness of Breath and Abdominal Pain   HPI  Jose Vance is a 36 y.o. male brought to the ED via EMS from home with a chief complaint of shortness of breath.  Patient has a history of ESRD on HD M/W/F; missed dialysis on Monday due to personal reasons.  Also states he has not been taking his amlodipine, Coreg or lisinopril for the past several days.  Does make some urine daily.  Plans to go to dialysis this morning and is also scheduled for an x-ray session on Thursday.  Reports mild diarrhea relieved by Kaopectate and Pepto-Bismol.  Denies fever, cough, chest pain, abdominal pain, nausea, vomiting or dizziness.     Past Medical History   Past Medical History:  Diagnosis Date   Chronic kidney disease    Hypertension    Stroke [facility]) 2015   partial left sided weakness     Active Problem List   Patient Active Problem List   Diagnosis Date Noted   Columnar-lined esophagus    Postpyloric ulcer    Melena    Symptomatic anemia 02/05/2019   GI bleed 02/05/2019     Past Surgical History   Past Surgical History:  Procedure Laterality Date   ESOPHAGOGASTRODUODENOSCOPY Left 02/06/2019   Procedure: ESOPHAGOGASTRODUODENOSCOPY (EGD);  Surgeon: Virgel Manifold, MD;  Location: [facility] ENDOSCOPY;  Service: Endoscopy;  Laterality: Left;   NO PAST SURGERIES       Home Medications   Prior to Admission medications   Medication Sig Start Date End Date Taking? Authorizing Provider  calcium acetate (PHOSLO) 667 MG capsule Take 1,334-2,001 mg by mouth 5 (five) times daily. 08/30/18   [provider]  lisinopril (PRINIVIL,ZESTRIL) 20 MG tablet Take 20 mg by mouth daily. 08/02/18   [provider]  pantoprazole (PROTONIX) 40 MG tablet Take 1 tablet (40 mg total) by mouth 2 (two) times daily. 02/07/19 02/07/20   Epifanio Lesches, MD     Allergies  Patient has no known allergies.   Family History   Family History  Problem Relation Age of Onset   Hypertension Mother      Physical Exam  Triage Vital Signs: ED Triage Vitals  Enc Vitals Group     BP 01/28/22 0026 (!) 191/139     Pulse Rate 01/28/22 0026 (!) 102     Resp 01/28/22 0026 18     Temp 01/28/22 0026 98.6 F (37 C)     Temp Source 01/28/22 0026 Oral     SpO2 01/28/22 0017 96 %     Weight 01/28/22 0021 165 lb (74.8 kg)     Height 01/28/22 0021 5' 7.5" (1.715 m)     Head Circumference --      Peak Flow --      Pain Score 01/28/22 0020 6     Pain Loc --      Pain Edu? --      Excl. in [place]? --     Updated Vital Signs: BP (!) 173/113 (BP Location: Left Arm)    Pulse 89    Temp 98.6 F (37 C) (Oral)    Resp 15    Ht 5' 7.5" (1.715 m)    Wt 74.8 kg    SpO2 100%    BMI 25.46 kg/m    General: Awake, no distress.  CV:  RRR.  Good peripheral perfusion.  Resp:  Normal effort.  CTA B. Abd:  Nontender.  No distention.  Other:  No calf pain or swelling.   ED Results / Procedures / Treatments  Labs (all labs ordered are listed, but only abnormal results are displayed) Labs Reviewed  CBC - Abnormal; Notable for the following components:      Result Value   RBC 3.53 (*)    Hemoglobin 11.4 (*)    HCT 35.9 (*)    MCV 101.7 (*)    RDW 16.1 (*)    All other components within normal limits  COMPREHENSIVE METABOLIC PANEL - Abnormal; Notable for the following components:   Sodium 134 (*)    Potassium 6.0 (*)    Chloride 97 (*)    CO2 20 (*)    BUN 82 (*)    Creatinine, Ser 14.12 (*)    AST 149 (*)    ALT 234 (*)    Total Bilirubin 1.7 (*)    GFR, Estimated 4 (*)    Anion gap 17 (*)    All other components within normal limits  TROPONIN I (HIGH SENSITIVITY) - Abnormal; Notable for the following components:   Troponin I (High Sensitivity) 120 (*)    All other components within normal limits  RESP PANEL BY RT-PCR (FLU  A&B, COVID) ARPGX2  LIPASE, BLOOD  URINALYSIS, ROUTINE W REFLEX MICROSCOPIC  TROPONIN I (HIGH SENSITIVITY)     EKG  ED ECG REPORT I, Azyria Osmon J, the attending physician, personally viewed and interpreted this ECG.   Date: 01/28/2022  EKG Time: 0036  Rate: 99  Rhythm: normal sinus rhythm  Axis: LAD  Intervals:none  ST&T Change: Nonspecific, no peaked T waves    RADIOLOGY I have personally reviewed patient's chest x-ray as well as the radiology interpretation:  Chest x-ray: Cardiomegaly, no overt edema  Official radiology report(s): DG Chest 1 View  Result Date: 01/28/2022 CLINICAL DATA:  Dyspnea EXAM: CHEST  1 VIEW COMPARISON:  02/05/2019 FINDINGS: Lungs are clear. No pneumothorax or pleural effusion. Mild cardiomegaly has developed, new since prior examination. Pulmonary vascularity is normal. No acute bone abnormality. IMPRESSION: Interval development of mild cardiomegaly. Electronically Signed   By: Fidela Salisbury M.D.   On: 01/28/2022 00:42     PROCEDURES:  Critical Care performed: Yes, see critical care procedure note(s)  CRITICAL CARE Performed by: Paulette Blanch   Total critical care time: 45 minutes  Critical care time was exclusive of separately billable procedures and treating other patients.  Critical care was necessary to treat or prevent imminent or life-threatening deterioration.  Critical care was time spent personally by me on the following activities: development of treatment plan with patient and/or surrogate as well as nursing, discussions with consultants, evaluation of patient's response to treatment, examination of patient, obtaining history from patient or surrogate, ordering and performing treatments and interventions, ordering and review of laboratory studies, ordering and review of radiographic studies, pulse oximetry and re-evaluation of patient's condition.   Marland Kitchen1-3 Lead EKG Interpretation Performed by: Paulette Blanch, MD Authorized by: Paulette Blanch, MD     Interpretation: normal     ECG rate:  98   ECG rate assessment: normal     Rhythm: sinus rhythm     Ectopy: none     Conduction: normal   Comments:     Patient placed on cardiac monitor to evaluate for arrhythmias   MEDICATIONS ORDERED IN ED: Medications  calcium gluconate  1 g/ 50 mL sodium chloride IVPB (has no administration in time range)  sodium bicarbonate injection 50 mEq (has no administration in time range)  dextrose 50 % solution 50 mL (has no administration in time range)  insulin aspart (novoLOG) injection 6 Units (has no administration in time range)  patiromer Daryll Drown) packet 8.4 g (has no administration in time range)  furosemide (LASIX) 100 mg in dextrose 5 % 50 mL IVPB (has no administration in time range)  hydrALAZINE (APRESOLINE) injection 10 mg (has no administration in time range)  cloNIDine (CATAPRES) tablet 0.2 mg (0.2 mg Oral Given 01/28/22 0252)  hydrALAZINE (APRESOLINE) injection 10 mg (10 mg Intravenous Given 01/28/22 0255)     IMPRESSION / MDM / ASSESSMENT AND PLAN / ED COURSE  I reviewed the triage vital signs and the nursing notes.                             36 year old male with ERSD , missed HD presenting with shortness of breath. Differential includes, but is not limited to, viral syndrome, bronchitis including COPD exacerbation, pneumonia, reactive airway disease including asthma, CHF including exacerbation with or without pulmonary/interstitial edema, pneumothorax, ACS, thoracic trauma, and pulmonary embolism.  I have personally reviewed patient's chart and see that he had a PCP video visit on 01/16/2022.  I am able to see what medications he is supposed to be taking and these include: Amlodipine 10 mg, Coreg 6.25 mg, lisinopril 20 mg and losartan 100 mg.  The patient is on the cardiac monitor to evaluate for evidence of arrhythmia and/or significant heart rate changes.  Will obtain cardiac panel, chest x-ray.  Respiratory panel is  negative.  Administer oral clonidine, IV hydralazine.  Placed on 2 L nasal cannula oxygen per patient's request; his room air saturation is currently 100%.  Will reassess.  Clinical Course as of 01/28/22 0352  Wed Jan 28, 2022  0343 BP 173/113 after IV hydralazine and oral clonidine.  Will redose IV hydralazine.  Laboratory results notable for normal WBC 7.8, hyperkalemia with potassium of 6, elevated transaminases and positive troponin 120 which is likely secondary to hypertensive urgency.  Patient not experiencing chest pain, only complains of shortness of breath.  Will consult hospitalist services for admission.  Will initiate hyperkalemia treatment. [JS]    Clinical Course User Index [JS] Paulette Blanch, MD     FINAL CLINICAL IMPRESSION(S) / ED DIAGNOSES   Final diagnoses:  Shortness of breath  Hypertensive urgency  Hyperkalemia  Elevated troponin     Rx / DC Orders   ED Discharge Orders     None        Note:  This document was prepared using Dragon voice recognition software and may include unintentional dictation errors.   Paulette Blanch, MD 01/28/22 [phone]

## 2022-01-28 NOTE — ED Notes (Signed)
IV team at the bedside. 

## 2022-01-28 NOTE — Progress Notes (Signed)
[facility]  ROUNDING NOTE   Subjective:   [name] was admitted to [facility] on 01/28/2022 for Shortness of breath [R06.02] Hyperkalemia [E87.5] SOB (shortness of breath) [R06.02] Elevated troponin [R77.8] Hypertensive urgency [I16.0] LFTs abnormal [R79.89] Hypertensive emergency [I16.1]  Last hemodialysis treatment was 2/3  Patient is 5 kg above his dry weight.   Complains of diarrhea and abdominal pain.   Hypertensive urgency on admission  Objective:  Vital signs in last 24 hours:  Temp:  [98.3 F (36.8 C)-98.6 F (37 C)] 98.3 F (36.8 C) (02/08 0804) Pulse Rate:  [73-102] 74 (02/08 0930) Resp:  [11-25] 12 (02/08 0930) BP: (144-194)/(94-147) 150/99 (02/08 0930) SpO2:  [96 %-100 %] 100 % (02/08 0930) Weight:  [74.8 kg-75.2 kg] 75.2 kg (02/08 0814)  Weight change:  Filed Weights   01/28/22 0021 01/28/22 0814  Weight: 74.8 kg 75.2 kg    Intake/Output: I/O last 3 completed shifts: In: 107.7 [IV Piggyback:107.7] Out: -    Intake/Output this shift:  No intake/output data recorded.  Physical Exam: General: NAD,   Head: Normocephalic, atraumatic. Moist oral mucosal membranes  Eyes: Anicteric, PERRL  Neck: Supple, trachea midline  Lungs:  Clear to auscultation  Heart: Regular rate and rhythm  Abdomen:  Soft, nontender,   Extremities:  trace peripheral edema.  Neurologic: Nonfocal, moving all four extremities  Skin: No lesions  Access: Left AVF    Basic Metabolic Panel: Recent Labs  Lab 01/28/22 0230 01/28/22 0847  NA 134* 133*  K 6.0* 4.5  CL 97* 97*  CO2 20* 21*  GLUCOSE 97 56*  BUN 82* 88*  CREATININE 14.12* 14.48*  CALCIUM 9.2 8.8*  PHOS  --  8.5*    Liver Function Tests: Recent Labs  Lab 01/28/22 0230 01/28/22 0847  AST 149*  --   ALT 234*  --   ALKPHOS 66  --   BILITOT 1.7*  --   PROT 6.5  --   ALBUMIN 3.6 3.2*   Recent Labs  Lab 01/28/22 0230  LIPASE 34   No results for input(s): AMMONIA in the last 168  hours.  CBC: Recent Labs  Lab 01/28/22 0032 01/28/22 0910  WBC 7.8 6.0  NEUTROABS  --  4.3  HGB 11.4* 9.4*  HCT 35.9* 28.5*  MCV 101.7* 96.9  PLT 238 170    Cardiac Enzymes: No results for input(s): CKTOTAL, CKMB, CKMBINDEX, TROPONINI in the last 168 hours.  BNP: Invalid input(s): POCBNP  CBG: No results for input(s): GLUCAP in the last 168 hours.  Microbiology: Results for orders placed or performed during the hospital encounter of 01/28/22  Resp Panel by RT-PCR (Flu A&B, Covid) Nasopharyngeal Swab     Status: None   Collection Time: 01/28/22 12:26 AM   Specimen: Nasopharyngeal Swab; Nasopharyngeal(NP) swabs in vial transport medium  Result Value Ref Range Status   SARS Coronavirus 2 by RT PCR NEGATIVE NEGATIVE Final    Comment: (NOTE) SARS-CoV-2 target nucleic acids are NOT DETECTED.  The SARS-CoV-2 RNA is generally detectable in upper respiratory specimens during the acute phase of infection. The lowest concentration of SARS-CoV-2 viral copies this assay can detect is 138 copies/mL. A negative result does not preclude SARS-Cov-2 infection and should not be used as the sole basis for treatment or other patient management decisions. A negative result may occur with  improper specimen collection/handling, submission of specimen other than nasopharyngeal swab, presence of viral mutation(s) within the areas targeted by this assay, and inadequate number of viral  copies(<138 copies/mL). A negative result must be combined with clinical observations, patient history, and epidemiological information. The expected result is Negative.  Fact Sheet for Patients:  EntrepreneurPulse.com.au  Fact Sheet for Healthcare Providers:  IncredibleEmployment.be  This test is no t yet approved or cleared by the [place] FDA and  has been authorized for detection and/or diagnosis of SARS-CoV-2 by FDA under an Emergency Use Authorization (EUA).  This EUA will remain  in effect (meaning this test can be used) for the duration of the COVID-19 declaration under Section 564(b)(1) of the Act, 21 U.S.C.section 360bbb-3(b)(1), unless the authorization is terminated  or revoked sooner.       Influenza A by PCR NEGATIVE NEGATIVE Final   Influenza B by PCR NEGATIVE NEGATIVE Final    Comment: (NOTE) The Xpert Xpress SARS-CoV-2/FLU/RSV plus assay is intended as an aid in the diagnosis of influenza from Nasopharyngeal swab specimens and should not be used as a sole basis for treatment. Nasal washings and aspirates are unacceptable for Xpert Xpress SARS-CoV-2/FLU/RSV testing.  Fact Sheet for Patients: EntrepreneurPulse.com.au  Fact Sheet for Healthcare Providers: IncredibleEmployment.be  This test is not yet approved or cleared by the [place] FDA and has been authorized for detection and/or diagnosis of SARS-CoV-2 by FDA under an Emergency Use Authorization (EUA). This EUA will remain in effect (meaning this test can be used) for the duration of the COVID-19 declaration under Section 564(b)(1) of the Act, 21 U.S.C. section 360bbb-3(b)(1), unless the authorization is terminated or revoked.  Performed at [facility], [address] [place], [place] [address]     Coagulation Studies: No results for input(s): LABPROT, INR in the last 72 hours.  Urinalysis: No results for input(s): COLORURINE, LABSPEC, PHURINE, GLUCOSEU, HGBUR, BILIRUBINUR, KETONESUR, PROTEINUR, UROBILINOGEN, NITRITE, LEUKOCYTESUR in the last 72 hours.  Invalid input(s): APPERANCEUR    Imaging: DG Chest 1 View  Result Date: 01/28/2022 CLINICAL DATA:  Dyspnea EXAM: CHEST  1 VIEW COMPARISON:  02/05/2019 FINDINGS: Lungs are clear. No pneumothorax or pleural effusion. Mild cardiomegaly has developed, new since prior examination. Pulmonary vascularity is normal. No acute bone abnormality. IMPRESSION: Interval  development of mild cardiomegaly. Electronically Signed   By: Fidela Salisbury M.D.   On: 01/28/2022 00:42   US Abdomen Limited RUQ (LIVER/GB)  Result Date: 01/28/2022 CLINICAL DATA:  Abnormal LFTs. EXAM: ULTRASOUND ABDOMEN LIMITED RIGHT UPPER QUADRANT COMPARISON:  One-view chest x-ray 01/28/2022 FINDINGS: Gallbladder: Diffuse gallbladder wall thickening noted. Wall measures up to 4.6 mm. Gallbladder is slightly contracted. No stones are present. No sonographic Percell Miller sign is reported. Common bile duct: Diameter: 2.1 mm, within normal limits. Liver: No focal lesion identified. Within normal limits in parenchymal echogenicity. Portal vein is patent on color Doppler imaging with normal direction of blood flow towards the liver. Other: The right kidney is atrophic and hyperechoic consistent with known end-stage renal disease. Abdominal ascites is present. IMPRESSION: 1. Mild diffuse abdominal ascites. 2. Mild diffuse gallbladder wall thickening. This may be secondary to the ascites. No other signs of acute cholecystitis. 3. Normal sonographic appearance of the liver. 4. Atrophic echogenic right kidney consistent with known end-stage renal disease. Electronically Signed   By: San Morelle M.D.   On: 01/28/2022 06:42     Medications:     heparin  5,000 Units Subcutaneous Q8H   nitroGLYCERIN  0.5 inch Topical Q6H   acetaminophen **OR** acetaminophen, ondansetron **OR** ondansetron (ZOFRAN) IV  Assessment/ Plan:  [name] is a 36 y.o. black male with  end stage renal disease on hemodialysis, hypertension, Hemorrhagic stroke with left sided residual weakness, GI bleed (H. Pylori +), who is admitted to [facility] on 01/28/2022 for Shortness of breath [R06.02] Hyperkalemia [E87.5] SOB (shortness of breath) [R06.02] Elevated troponin [R77.8] Hypertensive urgency [I16.0] LFTs abnormal [R79.89] Hypertensive emergency [I16.1]  [facility] MWF [facility] Right AVF 98kg  End Stage Renal Disease: with  hyperkalemia: seen and examined on emergent hemodialysis. Tolerating treatment well. Resume MWF schedule.   Hypertension: hypertensive urgency on admission. Given topical nitroglycerin. After dialysis, resume home regimen of amlodipine and carvedilol  Anemia of chronic kidney disease: hemoglobin 9.4. Mircera at outpatient. No indication for EPO today.   Secondary Hyperparathyroidism: with hyperphosphatemia. On etelcalcetide as outpatient. Restart calcium acetate with meals.    LOS: 0 Jose Vance 2/8/20239:38 AM

## 2022-01-28 NOTE — Assessment & Plan Note (Signed)
Nephrology consult for dialysis

## 2022-01-28 NOTE — ED Notes (Signed)
Critical troponin of 120, MD notified and aware

## 2022-01-28 NOTE — Assessment & Plan Note (Signed)
Suspecting demand ischemia from hypertensive emergency Should improve with improvement in

## 2022-01-28 NOTE — Assessment & Plan Note (Addendum)
Likely secondary to hepatic congestion from fluid overload and acute heart failure Given abdominal pain will get right upper quadrant ultrasound.

## 2022-01-28 NOTE — ED Notes (Signed)
Patient provided water per MD ok

## 2022-01-28 NOTE — Assessment & Plan Note (Signed)
Stable at 11

## 2022-01-28 NOTE — H&P (Signed)
History and Physical    Patient: Jose Vance YQI:[id number] DOB: 1986-08-15 DOA: 01/28/2022 DOS: the patient was seen and examined on 01/28/2022 PCP: Patient, No Pcp Per (Inactive)  Patient coming from: Home  Chief Complaint:  Chief Complaint  Patient presents with   Shortness of Breath   Abdominal Pain    HPI: Jose Vance is a 36 y.o. male with medical history significant of ESRD on HD MWF with last dialysis on 01/23/2022 and who admits to poor compliance with his antihypertensives and who still makes urine who presents to the ED with a several week history of intermittent shortness of breath that became acutely worse on the night of admission as he laid down to sleep.  He denies chest pain or lower extremity edema.  Also complains of abdominal pain and diarrhea that also started on the night of arrival.  He denies nausea or vomiting and denies fever or chills  ED course: On arrival BP 191/139 with pulse 102 and otherwise normal vitals Blood work: Potassium 6, AST/ALT of 149/234 with total bili 1.7 Lipase 34 Troponin 120 WBC 7.8 and hemoglobin 11.4  EKG, personally viewed and interpreted: Sinus at 99 with nonspecific ST-T wave changes.  No peaked T waves  Chest x-ray: Interval development of mild cardiomegaly.  Pulmonary vascularity is normal  Patient treated with calcium gluconate, insulin and dextrose, Lasix, Veltassa and sodium bicarb also given IV hydralazine and oral clonidine with improvement in BP to 170/110 by admission.  Hospitalist consulted for admission.   Review of Systems: As mentioned in the history of present illness. All other systems reviewed and are negative. Past Medical History:  Diagnosis Date   Chronic kidney disease    Hypertension    Stroke [facility]) 2015   partial left sided weakness   Past Surgical History:  Procedure Laterality Date   ESOPHAGOGASTRODUODENOSCOPY Left 02/06/2019   Procedure: ESOPHAGOGASTRODUODENOSCOPY (EGD);  Surgeon: Virgel Manifold, MD;  Location[facility] ENDOSCOPY;  Service: Endoscopy;  Laterality: Left;   NO PAST SURGERIES     Social History:  reports that he has quit smoking. He smoked an average of .5 packs per day. He has never used smokeless tobacco. He reports current drug use. Frequency: 3.00 times per week. Drug: Marijuana. He reports that he does not drink alcohol.  No Known Allergies  Family History  Problem Relation Age of Onset   Hypertension Mother     Prior to Admission medications   Medication Sig Start Date End Date Taking? Authorizing Provider  calcium acetate (PHOSLO) 667 MG capsule Take 1,334-2,001 mg by mouth 5 (five) times daily. 08/30/18   [provider]  lisinopril (PRINIVIL,ZESTRIL) 20 MG tablet Take 20 mg by mouth daily. 08/02/18   [provider]  pantoprazole (PROTONIX) 40 MG tablet Take 1 tablet (40 mg total) by mouth 2 (two) times daily. 02/07/19 02/07/20  Epifanio Lesches, MD    Physical Exam: Vitals:   01/28/22 0300 01/28/22 0330 01/28/22 0400 01/28/22 0406  BP: (!) 182/137 (!) 173/113 (!) 163/108 (!) 163/108  Pulse: 95 89 83   Resp: 19 15 17    Temp:      TempSrc:      SpO2: 100% 100% 100%   Weight:      Height:          Physical Exam Vitals and nursing note reviewed.  Constitutional:      General: He is not in acute distress.    Appearance: Normal appearance. He is ill-appearing.  HENT:  Head: Normocephalic and atraumatic.  Cardiovascular:     Rate and Rhythm: Normal rate and regular rhythm.     Pulses: Normal pulses.     Heart sounds: Normal heart sounds. No murmur heard. Pulmonary:     Effort: Pulmonary effort is normal.     Breath sounds: Decreased breath sounds present. No wheezing, rhonchi or rales.  Abdominal:     General: Bowel sounds are normal.     Palpations: Abdomen is soft.     Tenderness: There is no abdominal tenderness.  Musculoskeletal:        General: No swelling or tenderness. Normal range of motion.     Cervical  back: Normal range of motion and neck supple.     Right lower leg: No edema.     Left lower leg: No edema.  Skin:    General: Skin is warm and dry.  Neurological:     General: No focal deficit present.     Mental Status: Mental status is at baseline.  Psychiatric:        Mood and Affect: Mood normal.        Behavior: Behavior normal.     Data Reviewed: Notes from primary care and specialist visits, past discharge summaries. Prior diagnostic testing as applicable to current admission diagnoses Updated medications and problem lists for reconciliation ED course, including vitals, labs, imaging, treatment and response to treatment Triage notes and ED providers notes   Assessment and Plan: * Hypertensive emergency- (present on admission) IV Lasix, IV hydralazine Expect improvement with dialysis  Elevated troponin Suspecting demand ischemia from hypertensive emergency Should improve with improvement in  Hyperkalemia Received hyperkalemia cocktail in the ED Veltassa Continue to monitor potassium Improvement expected with dialysis in a.m.   Abnormal LFTs Likely secondary to hepatic congestion from fluid overload and acute heart failure Given abdominal pain will get right upper quadrant ultrasound.  Anemia of chronic kidney failure, stage 5 (HCC) Stable at 11  ESRD (end stage renal disease) on dialysis [facility]) Nephrology consult for dialysis    Advance Care Planning:   Code Status: Prior full  Consults: renal  Family Communication: none  Severity of Illness: The appropriate patient status for this patient is INPATIENT. Inpatient status is judged to be reasonable and necessary in order to provide the required intensity of service to ensure the patient's safety. The patient's presenting symptoms, physical exam findings, and initial radiographic and laboratory data in the context of their chronic comorbidities is felt to place them at high risk for further clinical  deterioration. Furthermore, it is not anticipated that the patient will be medically stable for discharge from the hospital within 2 midnights of admission.   * I certify that at the point of admission it is my clinical judgment that the patient will require inpatient hospital care spanning beyond 2 midnights from the point of admission due to high intensity of service, high risk for further deterioration and high frequency of surveillance required.*  Author: Athena Masse, MD 01/28/2022 4:25 AM  For on call review [email or web address].

## 2022-01-28 NOTE — Progress Notes (Signed)
Patient completes 3.5-hour treatment with 4-liter fluid removal. LUA AVF functions without difficulty, maintaining prescribed BFR. Vital signs stable, patient without concerns. Patient to return to ED, report given.

## 2022-01-28 NOTE — Assessment & Plan Note (Addendum)
IV Lasix, IV hydralazine prn nitropaste Expect improvement with dialysis

## 2022-01-28 NOTE — ED Triage Notes (Addendum)
Pt arrived via [facility] from home with reports of shortness of breath off and on for the past few weeks, pt states in NOV he had RSV.  Pt states tonight he has been unable to sleep due to the shortness of breath. Pt denies any LE swelling.  Pt also c/o abdominal pain that started tonight.  Pt is a dialysis patient, goes MWF, missed on Monday for personal reasons, due to return in the morning and will have an extra treatment on Thursday per pt.  Pt has had some diarrhea, states he took kaopectate and Pepto which helped some.

## 2022-01-28 NOTE — ED Notes (Signed)
Lab called stating that the light green tube was hemolyzed and needs to be redrawn. IV team consult placed for difficult IV insertion.

## 2022-01-28 NOTE — Assessment & Plan Note (Signed)
Received hyperkalemia cocktail in the ED Veltassa Continue to monitor potassium Improvement expected with dialysis in a.m.

## 2022-01-28 NOTE — Progress Notes (Signed)
No charge progress note.  Jose Vance is a 36 y.o. male with medical history significant of ESRD on HD MWF with last dialysis on 01/23/2022 and who admits to poor compliance with his antihypertensives and who still makes urine who presents to the ED with a several week history of intermittent shortness of breath that became acutely worse on the night of admission.  Patient missed 1 dialysis due to personal reasons, last dialysis was on 01/23/2022.  He was found to have markedly elevated blood pressure and hyperkalemia.  Troponin were elevated with a flat curve most likely secondary to demand ischemia.  EKG was with nonspecific ST/T wave changes, no peaked T waves.  Admitted for hypertensive urgency.  Received Veltassa for hyperkalemia and was taken for emergent dialysis.  He was 5 pounds above his dry weight.  Symptoms improved with improvement in blood pressure after dialysis.  Nephrology to decide about another session of dialysis tomorrow, might resume his normal routine of Monday, Wednesday and Friday.  Patient is feeling much improved after getting dialysis when seen during morning rounds.  Exam within normal limit.  Most likely be discharged tomorrow if remains stable.

## 2022-01-28 NOTE — ED Notes (Signed)
Attempted IV access x 1, unsuccessful, able to obtain small amount of blood to send to lab.

## 2022-01-29 DIAGNOSIS — I161 Hypertensive emergency: Secondary | ICD-10-CM | POA: Diagnosis not present

## 2022-01-29 LAB — CBC
HCT: 30.7 % — ABNORMAL LOW (ref 39.0–52.0)
Hemoglobin: 10.3 g/dL — ABNORMAL LOW (ref 13.0–17.0)
MCH: 32.4 pg (ref 26.0–34.0)
MCHC: 33.6 g/dL (ref 30.0–36.0)
MCV: 96.5 fL (ref 80.0–100.0)
Platelets: 175 10*3/uL (ref 150–400)
RBC: 3.18 MIL/uL — ABNORMAL LOW (ref 4.22–5.81)
RDW: 15.8 % — ABNORMAL HIGH (ref 11.5–15.5)
WBC: 5 10*3/uL (ref 4.0–10.5)
nRBC: 0 % (ref 0.0–0.2)

## 2022-01-29 LAB — RENAL FUNCTION PANEL
Albumin: 3 g/dL — ABNORMAL LOW (ref 3.5–5.0)
Anion gap: 13 (ref 5–15)
BUN: 50 mg/dL — ABNORMAL HIGH (ref 6–20)
CO2: 25 mmol/L (ref 22–32)
Calcium: 8.6 mg/dL — ABNORMAL LOW (ref 8.9–10.3)
Chloride: 96 mmol/L — ABNORMAL LOW (ref 98–111)
Creatinine, Ser: 9.15 mg/dL — ABNORMAL HIGH (ref 0.61–1.24)
GFR, Estimated: 7 mL/min — ABNORMAL LOW (ref >60)
Glucose, Bld: 120 mg/dL — ABNORMAL HIGH (ref 70–99)
Phosphorus: 6 mg/dL — ABNORMAL HIGH (ref 2.5–4.6)
Potassium: 4.3 mmol/L (ref 3.5–5.1)
Sodium: 134 mmol/L — ABNORMAL LOW (ref 135–145)

## 2022-01-29 LAB — HEPATITIS B SURFACE ANTIBODY, QUANTITATIVE: Hep B S AB Quant (Post): 349.6 m[IU]/mL (ref >9.9)

## 2022-01-29 MED ORDER — HYDRALAZINE HCL 25 MG PO TABS: 25.0000 mg | ORAL_TABLET | Freq: Three times a day (TID) | ORAL | 0 refills | Status: AC

## 2022-01-29 MED ORDER — HYDRALAZINE HCL 25 MG PO TABS
25.0000 mg | ORAL_TABLET | Freq: Three times a day (TID) | ORAL | Status: DC
  Administered 2022-01-29 (×2): 25 mg via ORAL
  Filled 2022-01-29 (×2): qty 1

## 2022-01-29 NOTE — Care Management Obs Status (Signed)
[place] NOTIFICATION   Patient Details  Name: Jose Vance MRN: [id number] Date of Birth: [birth year]   Medicare Observation Status Notification Given:  Sandi Raveling, LCSW 01/29/2022, 2:03 PM

## 2022-01-29 NOTE — Progress Notes (Signed)
°  Transition of Care [facility]) Screening Note   Patient Details  Name: Audon Heymann Date of Birth: 09-21-1986   Transition of Care [facility]) CM/SW Contact:    Alberteen Sam, LCSW Phone Number: 01/29/2022, 11:59 AM    Transition of Care Department [facility]) has reviewed patient and no TOC needs have been identified at this time. We will continue to monitor patient advancement through interdisciplinary progression rounds. If new patient transition needs arise, please place a TOC consult.  [place], [place]

## 2022-01-29 NOTE — Progress Notes (Signed)
[facility]  ROUNDING NOTE   Subjective:   [name] was admitted to [facility] on 01/28/2022 for Shortness of breath [R06.02] Hyperkalemia [E87.5] SOB (shortness of breath) [R06.02] Elevated troponin [R77.8] Hypertensive urgency [I16.0] LFTs abnormal [R79.89] Hypertensive emergency [I16.1]  Patient seen resting quietly Dialysis yesterday, tolerated well with 3.5L removed  Denies shortness of breath  Objective:  Vital signs in last 24 hours:  Temp:  [97.5 F (36.4 C)-98.5 F (36.9 C)] 98.1 F (36.7 C) (02/09 1217) Pulse Rate:  [74-88] 77 (02/09 1217) Resp:  [14-20] 14 (02/09 1217) BP: (148-165)/(100-117) 148/105 (02/09 1217) SpO2:  [97 %-100 %] 100 % (02/09 1217) Weight:  [70.1 kg] 70.1 kg (02/08 2237)  Weight change: 0.356 kg Filed Weights   01/28/22 0814 01/28/22 1202 01/28/22 2237  Weight: 75.2 kg 68.7 kg 70.1 kg    Intake/Output: I/O last 3 completed shifts: In: 107.7 [IV Piggyback:107.7] Out: 3500 [Other:3500]   Intake/Output this shift:  Total I/O In: 480 [P.O.:480] Out: -   Physical Exam: General: NAD,   Head: Normocephalic, atraumatic. Moist oral mucosal membranes  Eyes: Anicteric  Lungs:  Clear to auscultation, normal effort, room air  Heart: Regular rate and rhythm  Abdomen:  Soft, nontender  Extremities:  trace peripheral edema.  Neurologic: Nonfocal, moving all four extremities  Skin: No lesions  Access: Left AVF    Basic Metabolic Panel: Recent Labs  Lab 01/28/22 0230 01/28/22 0847 01/29/22 0954  NA 134* 133* 134*  K 6.0* 4.5 4.3  CL 97* 97* 96*  CO2 20* 21* 25  GLUCOSE 97 56* 120*  BUN 82* 88* 50*  CREATININE 14.12* 14.48* 9.15*  CALCIUM 9.2 8.8* 8.6*  PHOS  --  8.5* 6.0*     Liver Function Tests: Recent Labs  Lab 01/28/22 0230 01/28/22 0847 01/29/22 0954  AST 149*  --   --   ALT 234*  --   --   ALKPHOS 66  --   --   BILITOT 1.7*  --   --   PROT 6.5  --   --   ALBUMIN 3.6 3.2* 3.0*    Recent Labs   Lab 01/28/22 0230  LIPASE 34    No results for input(s): AMMONIA in the last 168 hours.  CBC: Recent Labs  Lab 01/28/22 0032 01/28/22 0910 01/29/22 0954  WBC 7.8 6.0 5.0  NEUTROABS  --  4.3  --   HGB 11.4* 9.4* 10.3*  HCT 35.9* 28.5* 30.7*  MCV 101.7* 96.9 96.5  PLT 238 170 175     Cardiac Enzymes: No results for input(s): CKTOTAL, CKMB, CKMBINDEX, TROPONINI in the last 168 hours.  BNP: Invalid input(s): POCBNP  CBG: No results for input(s): GLUCAP in the last 168 hours.  Microbiology: Results for orders placed or performed during the hospital encounter of 01/28/22  Resp Panel by RT-PCR (Flu A&B, Covid) Nasopharyngeal Swab     Status: None   Collection Time: 01/28/22 12:26 AM   Specimen: Nasopharyngeal Swab; Nasopharyngeal(NP) swabs in vial transport medium  Result Value Ref Range Status   SARS Coronavirus 2 by RT PCR NEGATIVE NEGATIVE Final    Comment: (NOTE) SARS-CoV-2 target nucleic acids are NOT DETECTED.  The SARS-CoV-2 RNA is generally detectable in upper respiratory specimens during the acute phase of infection. The lowest concentration of SARS-CoV-2 viral copies this assay can detect is 138 copies/mL. A negative result does not preclude SARS-Cov-2 infection and should not be used as the sole basis for treatment or other  patient management decisions. A negative result may occur with  improper specimen collection/handling, submission of specimen other than nasopharyngeal swab, presence of viral mutation(s) within the areas targeted by this assay, and inadequate number of viral copies(<138 copies/mL). A negative result must be combined with clinical observations, patient history, and epidemiological information. The expected result is Negative.  Fact Sheet for Patients:  EntrepreneurPulse.com.au  Fact Sheet for Healthcare Providers:  IncredibleEmployment.be  This test is no t yet approved or cleared by the [place] FDA and  has been authorized for detection and/or diagnosis of SARS-CoV-2 by FDA under an Emergency Use Authorization (EUA). This EUA will remain  in effect (meaning this test can be used) for the duration of the COVID-19 declaration under Section 564(b)(1) of the Act, 21 U.S.C.section 360bbb-3(b)(1), unless the authorization is terminated  or revoked sooner.       Influenza A by PCR NEGATIVE NEGATIVE Final   Influenza B by PCR NEGATIVE NEGATIVE Final    Comment: (NOTE) The Xpert Xpress SARS-CoV-2/FLU/RSV plus assay is intended as an aid in the diagnosis of influenza from Nasopharyngeal swab specimens and should not be used as a sole basis for treatment. Nasal washings and aspirates are unacceptable for Xpert Xpress SARS-CoV-2/FLU/RSV testing.  Fact Sheet for Patients: EntrepreneurPulse.com.au  Fact Sheet for Healthcare Providers: IncredibleEmployment.be  This test is not yet approved or cleared by the [place] FDA and has been authorized for detection and/or diagnosis of SARS-CoV-2 by FDA under an Emergency Use Authorization (EUA). This EUA will remain in effect (meaning this test can be used) for the duration of the COVID-19 declaration under Section 564(b)(1) of the Act, 21 U.S.C. section 360bbb-3(b)(1), unless the authorization is terminated or revoked.  Performed at [facility], [address] [place], [place] [address]     Coagulation Studies: No results for input(s): LABPROT, INR in the last 72 hours.  Urinalysis: No results for input(s): COLORURINE, LABSPEC, PHURINE, GLUCOSEU, HGBUR, BILIRUBINUR, KETONESUR, PROTEINUR, UROBILINOGEN, NITRITE, LEUKOCYTESUR in the last 72 hours.  Invalid input(s): APPERANCEUR    Imaging: DG Chest 1 View  Result Date: 01/28/2022 CLINICAL DATA:  Dyspnea EXAM: CHEST  1 VIEW COMPARISON:  02/05/2019 FINDINGS: Lungs are clear. No pneumothorax or pleural effusion. Mild  cardiomegaly has developed, new since prior examination. Pulmonary vascularity is normal. No acute bone abnormality. IMPRESSION: Interval development of mild cardiomegaly. Electronically Signed   By: Fidela Salisbury M.D.   On: 01/28/2022 00:42   US Abdomen Limited RUQ (LIVER/GB)  Result Date: 01/28/2022 CLINICAL DATA:  Abnormal LFTs. EXAM: ULTRASOUND ABDOMEN LIMITED RIGHT UPPER QUADRANT COMPARISON:  One-view chest x-ray 01/28/2022 FINDINGS: Gallbladder: Diffuse gallbladder wall thickening noted. Wall measures up to 4.6 mm. Gallbladder is slightly contracted. No stones are present. No sonographic Percell Miller sign is reported. Common bile duct: Diameter: 2.1 mm, within normal limits. Liver: No focal lesion identified. Within normal limits in parenchymal echogenicity. Portal vein is patent on color Doppler imaging with normal direction of blood flow towards the liver. Other: The right kidney is atrophic and hyperechoic consistent with known end-stage renal disease. Abdominal ascites is present. IMPRESSION: 1. Mild diffuse abdominal ascites. 2. Mild diffuse gallbladder wall thickening. This may be secondary to the ascites. No other signs of acute cholecystitis. 3. Normal sonographic appearance of the liver. 4. Atrophic echogenic right kidney consistent with known end-stage renal disease. Electronically Signed   By: San Morelle M.D.   On: 01/28/2022 06:42     Medications:     amLODipine  10 mg Oral Daily   calcium acetate  2,001 mg Oral TID WC   carvedilol  6.25 mg Oral BID WC   heparin  5,000 Units Subcutaneous Q8H   hydrALAZINE  25 mg Oral Q8H   nitroGLYCERIN  0.5 inch Topical Q6H   acetaminophen **OR** acetaminophen, ondansetron **OR** ondansetron (ZOFRAN) IV  Assessment/ Plan:  Mr. Sabian Kuba is a 36 y.o. 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 Pylori +), who is admitted to [facility] on 01/28/2022 for  Shortness of breath [R06.02] Hyperkalemia [E87.5] SOB (shortness of breath) [R06.02] Elevated troponin [R77.8] Hypertensive urgency [I16.0] LFTs abnormal [R79.89] Hypertensive emergency [I16.1]  [facility] M[facility] Right AVF 98kg  End Stage Renal Disease: with hyperkalemia: Received dialysis yesterday, UF 3.5L achieved. Potassium 4.3. Next treatment scheduled for Friday.  Cleared to discharge from renal stance.   Hypertension: hypertensive urgency on admission. Given topical nitroglycerin. After dialysis, resume home regimen of amlodipine and carvedilol. BP 148/105  Anemia of chronic kidney disease: hemoglobin now 10.3 Mircera at outpatient. No indication for EPO today.   Secondary Hyperparathyroidism: with hyperphosphatemia. On etelcalcetide as outpatient. Continue calcium acetate with meals.    LOS: 1 Xan Ingraham 2/9/20232:51 PM

## 2022-01-29 NOTE — Plan of Care (Signed)

## 2022-01-29 NOTE — Discharge Summary (Signed)
Physician Discharge Summary   Patient: Jose Vance MRN: [id number] DOB: [birth year]  Admit date:     01/28/2022  Discharge date: 01/29/22  Discharge Physician: Lorella Nimrod   PCP: Patient, No Pcp Per (Inactive)   Recommendations at discharge:   Continue dialysis according to your schedule. Please obtain renal function during next dialysis  Discharge Diagnoses: Principal Problem:   Hypertensive emergency Active Problems:   ESRD (end stage renal disease) on dialysis ([place])   Elevated troponin   Abnormal LFTs   Hyperkalemia   Anemia of chronic kidney failure, stage 5 [facility])   Hospital Course: Jose Vance is a 36 y.o. male with medical history significant of ESRD on HD MWF with last dialysis on 01/23/2022 and who admits to poor compliance with his antihypertensives and who still makes urine who presents to the ED with a several week history of intermittent shortness of breath that became acutely worse on the night of admission.  Patient missed 1 dialysis due to personal reasons, last dialysis was on 01/23/2022.   He was found to have markedly elevated blood pressure and hyperkalemia.  Troponin were elevated with a flat curve most likely secondary to demand ischemia.   EKG was with nonspecific ST/T wave changes, no peaked T waves.   Admitted for hypertensive urgency.  Received V[place] for hyperkalemia and was taken for emergent dialysis.  He was 5 pounds above his dry weight.   Symptoms improved with improvement in blood pressure after dialysis Blood pressure remains little elevated, he was started on low-dose hydralazine and his nephrologist can titrate the dose as needed.  He was advised to continue his routine dialysis.  Potassium was within normal limit after the dialysis.  Patient was also found to have mildly elevated troponin with a flat curve, most likely secondary to demand ischemia.  He will continue the rest of his home medications and follow-up with his  providers.  Consultants: Nephrology Procedures performed: Hemodialysis Disposition: Home Diet recommendation:  Discharge Diet Orders (From admission, onward)     Start     Ordered   01/29/22 0000  Diet - low sodium heart healthy        01/29/22 1331           Cardiac diet  DISCHARGE MEDICATION: Allergies as of 01/29/2022   No Known Allergies      Medication List     STOP taking these medications    lisinopril 20 MG tablet Commonly known as: ZESTRIL   pantoprazole 40 MG tablet Commonly known as: Protonix       TAKE these medications    amLODipine 10 MG tablet Commonly known as: NORVASC Take 10 mg by mouth daily.   calcium acetate 667 MG capsule Commonly known as: PHOSLO Take 1,334-2,001 mg by mouth 5 (five) times daily.   carvedilol 6.25 MG tablet Commonly known as: COREG Take 6.25 mg by mouth 2 (two) times daily.   hydrALAZINE 25 MG tablet Commonly known as: APRESOLINE Take 1 tablet (25 mg total) by mouth every 8 (eight) hours.         Discharge Exam: Filed Weights   01/28/22 0814 01/28/22 1202 01/28/22 2237  Weight: 75.2 kg 68.7 kg 70.1 kg   General.     In no acute distress. Pulmonary.  Lungs clear bilaterally, normal respiratory effort. CV.  Regular rate and rhythm, no JVD, rub or murmur. Abdomen.  Soft, nontender, nondistended, BS positive. CNS.  Alert and oriented x3.  No focal neurologic deficit. Extremities.  No  edema, no cyanosis, pulses intact and symmetrical. Psychiatry.  Judgment and insight appears normal.   Condition at discharge: stable  The results of significant diagnostics from this hospitalization (including imaging, microbiology, ancillary and laboratory) are listed below for reference.   Imaging Studies: DG Chest 1 View  Result Date: 01/28/2022 CLINICAL DATA:  Dyspnea EXAM: CHEST  1 VIEW COMPARISON:  02/05/2019 FINDINGS: Lungs are clear. No pneumothorax or pleural effusion. Mild cardiomegaly has developed, new since  prior examination. Pulmonary vascularity is normal. No acute bone abnormality. IMPRESSION: Interval development of mild cardiomegaly. Electronically Signed   By: Fidela Salisbury M.D.   On: 01/28/2022 00:42   US Abdomen Limited RUQ (LIVER/GB)  Result Date: 01/28/2022 CLINICAL DATA:  Abnormal LFTs. EXAM: ULTRASOUND ABDOMEN LIMITED RIGHT UPPER QUADRANT COMPARISON:  One-view chest x-ray 01/28/2022 FINDINGS: Gallbladder: Diffuse gallbladder wall thickening noted. Wall measures up to 4.6 mm. Gallbladder is slightly contracted. No stones are present. No sonographic Percell Miller sign is reported. Common bile duct: Diameter: 2.1 mm, within normal limits. Liver: No focal lesion identified. Within normal limits in parenchymal echogenicity. Portal vein is patent on color Doppler imaging with normal direction of blood flow towards the liver. Other: The right kidney is atrophic and hyperechoic consistent with known end-stage renal disease. Abdominal ascites is present. IMPRESSION: 1. Mild diffuse abdominal ascites. 2. Mild diffuse gallbladder wall thickening. This may be secondary to the ascites. No other signs of acute cholecystitis. 3. Normal sonographic appearance of the liver. 4. Atrophic echogenic right kidney consistent with known end-stage renal disease. Electronically Signed   By: San Morelle M.D.   On: 01/28/2022 06:42    Microbiology: Results for orders placed or performed during the hospital encounter of 01/28/22  Resp Panel by RT-PCR (Flu A&B, Covid) Nasopharyngeal Swab     Status: None   Collection Time: 01/28/22 12:26 AM   Specimen: Nasopharyngeal Swab; Nasopharyngeal(NP) swabs in vial transport medium  Result Value Ref Range Status   SARS Coronavirus 2 by RT PCR NEGATIVE NEGATIVE Final    Comment: (NOTE) SARS-CoV-2 target nucleic acids are NOT DETECTED.  The SARS-CoV-2 RNA is generally detectable in upper respiratory specimens during the acute phase of infection. The lowest concentration of  SARS-CoV-2 viral copies this assay can detect is 138 copies/mL. A negative result does not preclude SARS-Cov-2 infection and should not be used as the sole basis for treatment or other patient management decisions. A negative result may occur with  improper specimen collection/handling, submission of specimen other than nasopharyngeal swab, presence of viral mutation(s) within the areas targeted by this assay, and inadequate number of viral copies(<138 copies/mL). A negative result must be combined with clinical observations, patient history, and epidemiological information. The expected result is Negative.  Fact Sheet for Patients:  EntrepreneurPulse.com.au  Fact Sheet for Healthcare Providers:  IncredibleEmployment.be  This test is no t yet approved or cleared by the [place] FDA and  has been authorized for detection and/or diagnosis of SARS-CoV-2 by FDA under an Emergency Use Authorization (EUA). This EUA will remain  in effect (meaning this test can be used) for the duration of the COVID-19 declaration under Section 564(b)(1) of the Act, 21 U.S.C.section 360bbb-3(b)(1), unless the authorization is terminated  or revoked sooner.       Influenza A by PCR NEGATIVE NEGATIVE Final   Influenza B by PCR NEGATIVE NEGATIVE Final    Comment: (NOTE) The Xpert Xpress SARS-CoV-2/FLU/RSV plus assay is intended as an aid in the diagnosis of influenza from Nasopharyngeal swab  specimens and should not be used as a sole basis for treatment. Nasal washings and aspirates are unacceptable for Xpert Xpress SARS-CoV-2/FLU/RSV testing.  Fact Sheet for Patients: EntrepreneurPulse.com.au  Fact Sheet for Healthcare Providers: IncredibleEmployment.be  This test is not yet approved or cleared by the [place] FDA and has been authorized for detection and/or diagnosis of SARS-CoV-2 by FDA under an Emergency Use  Authorization (EUA). This EUA will remain in effect (meaning this test can be used) for the duration of the COVID-19 declaration under Section 564(b)(1) of the Act, 21 U.S.C. section 360bbb-3(b)(1), unless the authorization is terminated or revoked.  Performed at [facility], [address] [place], [place] [address]     Labs: CBC: Recent Labs  Lab 01/28/22 0032 01/28/22 0910 01/29/22 0954  WBC 7.8 6.0 5.0  NEUTROABS  --  4.3  --   HGB 11.4* 9.4* 10.3*  HCT 35.9* 28.5* 30.7*  MCV 101.7* 96.9 96.5  PLT 238 170 893   Basic Metabolic Panel: Recent Labs  Lab 01/28/22 0230 01/28/22 0847 01/29/22 0954  NA 134* 133* 134*  K 6.0* 4.5 4.3  CL 97* 97* 96*  CO2 20* 21* 25  GLUCOSE 97 56* 120*  BUN 82* 88* 50*  CREATININE 14.12* 14.48* 9.15*  CALCIUM 9.2 8.8* 8.6*  PHOS  --  8.5* 6.0*   Liver Function Tests: Recent Labs  Lab 01/28/22 0230 01/28/22 0847 01/29/22 0954  AST 149*  --   --   ALT 234*  --   --   ALKPHOS 66  --   --   BILITOT 1.7*  --   --   PROT 6.5  --   --   ALBUMIN 3.6 3.2* 3.0*   CBG: No results for input(s): GLUCAP in the last 168 hours.  Discharge time spent: greater than 30 minutes.  Signed: Lorella Nimrod, MD [facility] 01/29/2022

## 2022-01-29 NOTE — Care Management CC44 (Signed)
Condition Code 44 Documentation Completed  Patient Details  Name: Jose Vance MRN: [id number] Date of Birth: 1986/10/04   Condition Code 44 given:  Yes Patient signature on Condition Code 44 notice:  Yes Documentation of 2 MD's agreement:  Yes Code 44 added to claim:  Yes    Alberteen Sam, LCSW 01/29/2022, 2:03 PM

## 2022-01-29 NOTE — Progress Notes (Signed)
[facility]  ROUNDING NOTE   Subjective:   [name] was admitted to [facility] on 01/28/2022 for Shortness of breath [R06.02] Hyperkalemia [E87.5] SOB (shortness of breath) [R06.02] Elevated troponin [R77.8] Hypertensive urgency [I16.0] LFTs abnormal [R79.89] Hypertensive emergency [I16.1]  Patient seen resting quietly Dialysis yesterday, tolerated well with 3.5L removed  Denies shortness of breath  Objective:  Vital signs in last 24 hours:  Temp:  [97.5 F (36.4 C)-98.5 F (36.9 C)] 98.1 F (36.7 C) (02/09 1217) Pulse Rate:  [74-88] 77 (02/09 1217) Resp:  [14-20] 14 (02/09 1217) BP: (148-165)/(100-117) 148/105 (02/09 1217) SpO2:  [97 %-100 %] 100 % (02/09 1217) Weight:  [70.1 kg] 70.1 kg (02/08 2237)  Weight change: 0.356 kg Filed Weights   01/28/22 0814 01/28/22 1202 01/28/22 2237  Weight: 75.2 kg 68.7 kg 70.1 kg    Intake/Output: I/O last 3 completed shifts: In: 107.7 [IV Piggyback:107.7] Out: 3500 [Other:3500]   Intake/Output this shift:  Total I/O In: 240 [P.O.:240] Out: -   Physical Exam: General: NAD,   Head: Normocephalic, atraumatic. Moist oral mucosal membranes  Eyes: Anicteric  Lungs:  Clear to auscultation, normal effort, room air  Heart: Regular rate and rhythm  Abdomen:  Soft, nontender  Extremities:  trace peripheral edema.  Neurologic: Nonfocal, moving all four extremities  Skin: No lesions  Access: Left AVF    Basic Metabolic Panel: Recent Labs  Lab 01/28/22 0230 01/28/22 0847 01/29/22 0954  NA 134* 133* 134*  K 6.0* 4.5 4.3  CL 97* 97* 96*  CO2 20* 21* 25  GLUCOSE 97 56* 120*  BUN 82* 88* 50*  CREATININE 14.12* 14.48* 9.15*  CALCIUM 9.2 8.8* 8.6*  PHOS  --  8.5* 6.0*     Liver Function Tests: Recent Labs  Lab 01/28/22 0230 01/28/22 0847 01/29/22 0954  AST 149*  --   --   ALT 234*  --   --   ALKPHOS 66  --   --   BILITOT 1.7*  --   --   PROT 6.5  --   --   ALBUMIN 3.6 3.2* 3.0*    Recent Labs   Lab 01/28/22 0230  LIPASE 34    No results for input(s): AMMONIA in the last 168 hours.  CBC: Recent Labs  Lab 01/28/22 0032 01/28/22 0910 01/29/22 0954  WBC 7.8 6.0 5.0  NEUTROABS  --  4.3  --   HGB 11.4* 9.4* 10.3*  HCT 35.9* 28.5* 30.7*  MCV 101.7* 96.9 96.5  PLT 238 170 175     Cardiac Enzymes: No results for input(s): CKTOTAL, CKMB, CKMBINDEX, TROPONINI in the last 168 hours.  BNP: Invalid input(s): POCBNP  CBG: No results for input(s): GLUCAP in the last 168 hours.  Microbiology: Results for orders placed or performed during the hospital encounter of 01/28/22  Resp Panel by RT-PCR (Flu A&B, Covid) Nasopharyngeal Swab     Status: None   Collection Time: 01/28/22 12:26 AM   Specimen: Nasopharyngeal Swab; Nasopharyngeal(NP) swabs in vial transport medium  Result Value Ref Range Status   SARS Coronavirus 2 by RT PCR NEGATIVE NEGATIVE Final    Comment: (NOTE) SARS-CoV-2 target nucleic acids are NOT DETECTED.  The SARS-CoV-2 RNA is generally detectable in upper respiratory specimens during the acute phase of infection. The lowest concentration of SARS-CoV-2 viral copies this assay can detect is 138 copies/mL. A negative result does not preclude SARS-Cov-2 infection and should not be used as the sole basis for treatment or other  patient management decisions. A negative result may occur with  improper specimen collection/handling, submission of specimen other than nasopharyngeal swab, presence of viral mutation(s) within the areas targeted by this assay, and inadequate number of viral copies(<138 copies/mL). A negative result must be combined with clinical observations, patient history, and epidemiological information. The expected result is Negative.  Fact Sheet for Patients:  EntrepreneurPulse.com.au  Fact Sheet for Healthcare Providers:  IncredibleEmployment.be  This test is no t yet approved or cleared by the [place] FDA and  has been authorized for detection and/or diagnosis of SARS-CoV-2 by FDA under an Emergency Use Authorization (EUA). This EUA will remain  in effect (meaning this test can be used) for the duration of the COVID-19 declaration under Section 564(b)(1) of the Act, 21 U.S.C.section 360bbb-3(b)(1), unless the authorization is terminated  or revoked sooner.       Influenza A by PCR NEGATIVE NEGATIVE Final   Influenza B by PCR NEGATIVE NEGATIVE Final    Comment: (NOTE) The Xpert Xpress SARS-CoV-2/FLU/RSV plus assay is intended as an aid in the diagnosis of influenza from Nasopharyngeal swab specimens and should not be used as a sole basis for treatment. Nasal washings and aspirates are unacceptable for Xpert Xpress SARS-CoV-2/FLU/RSV testing.  Fact Sheet for Patients: EntrepreneurPulse.com.au  Fact Sheet for Healthcare Providers: IncredibleEmployment.be  This test is not yet approved or cleared by the [place] FDA and has been authorized for detection and/or diagnosis of SARS-CoV-2 by FDA under an Emergency Use Authorization (EUA). This EUA will remain in effect (meaning this test can be used) for the duration of the COVID-19 declaration under Section 564(b)(1) of the Act, 21 U.S.C. section 360bbb-3(b)(1), unless the authorization is terminated or revoked.  Performed at [facility], [address] [place], [place] [address]     Coagulation Studies: No results for input(s): LABPROT, INR in the last 72 hours.  Urinalysis: No results for input(s): COLORURINE, LABSPEC, PHURINE, GLUCOSEU, HGBUR, BILIRUBINUR, KETONESUR, PROTEINUR, UROBILINOGEN, NITRITE, LEUKOCYTESUR in the last 72 hours.  Invalid input(s): APPERANCEUR    Imaging: DG Chest 1 View  Result Date: 01/28/2022 CLINICAL DATA:  Dyspnea EXAM: CHEST  1 VIEW COMPARISON:  02/05/2019 FINDINGS: Lungs are clear. No pneumothorax or pleural effusion. Mild  cardiomegaly has developed, new since prior examination. Pulmonary vascularity is normal. No acute bone abnormality. IMPRESSION: Interval development of mild cardiomegaly. Electronically Signed   By: Fidela Salisbury M.D.   On: 01/28/2022 00:42   US Abdomen Limited RUQ (LIVER/GB)  Result Date: 01/28/2022 CLINICAL DATA:  Abnormal LFTs. EXAM: ULTRASOUND ABDOMEN LIMITED RIGHT UPPER QUADRANT COMPARISON:  One-view chest x-ray 01/28/2022 FINDINGS: Gallbladder: Diffuse gallbladder wall thickening noted. Wall measures up to 4.6 mm. Gallbladder is slightly contracted. No stones are present. No sonographic Percell Miller sign is reported. Common bile duct: Diameter: 2.1 mm, within normal limits. Liver: No focal lesion identified. Within normal limits in parenchymal echogenicity. Portal vein is patent on color Doppler imaging with normal direction of blood flow towards the liver. Other: The right kidney is atrophic and hyperechoic consistent with known end-stage renal disease. Abdominal ascites is present. IMPRESSION: 1. Mild diffuse abdominal ascites. 2. Mild diffuse gallbladder wall thickening. This may be secondary to the ascites. No other signs of acute cholecystitis. 3. Normal sonographic appearance of the liver. 4. Atrophic echogenic right kidney consistent with known end-stage renal disease. Electronically Signed   By: San Morelle M.D.   On: 01/28/2022 06:42     Medications:     amLODipine  10 mg Oral Daily   calcium acetate  2,001 mg Oral TID WC   carvedilol  6.25 mg Oral BID WC   heparin  5,000 Units Subcutaneous Q8H   hydrALAZINE  25 mg Oral Q8H   nitroGLYCERIN  0.5 inch Topical Q6H   acetaminophen **OR** acetaminophen, ondansetron **OR** ondansetron (ZOFRAN) IV  Assessment/ Plan:  [name] is a 36 y.o. 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 Pylori +), who is admitted to [facility] on 01/28/2022 for  Shortness of breath [R06.02] Hyperkalemia [E87.5] SOB (shortness of breath) [R06.02] Elevated troponin [R77.8] Hypertensive urgency [I16.0] LFTs abnormal [R79.89] Hypertensive emergency [I16.1]  [facility] Right AVF 98kg  End Stage Renal Disease: with hyperkalemia: Received dialysis yesterday, UF 3.5L achieved. Potassium 4.3. Next treatment scheduled for Friday. Cleared to discharge from renal stance.   Hypertension: hypertensive urgency on admission. Given topical nitroglycerin. After dialysis, resume home regimen of amlodipine and carvedilol. BP 148/105  Anemia of chronic kidney disease: hemoglobin now 10.3 Mircera at outpatient. No indication for EPO today.   Secondary Hyperparathyroidism: with hyperphosphatemia. On etelcalcetide as outpatient. Continue calcium acetate with meals.    LOS: [place] 2/9/20232:24 PM

## 2022-08-24 IMAGING — US US ABDOMEN LIMITED
1 series · 14 of 25 positions shown · non-contrast
Comparison: One-view chest x-ray 01/28/2022

CLINICAL DATA: Abnormal LFTs.

EXAM:
ULTRASOUND ABDOMEN LIMITED RIGHT UPPER QUADRANT

[Series 1: us abdomen limited ruq (liver/gb) · 14 of 44 slices shown]
[im 1/44]
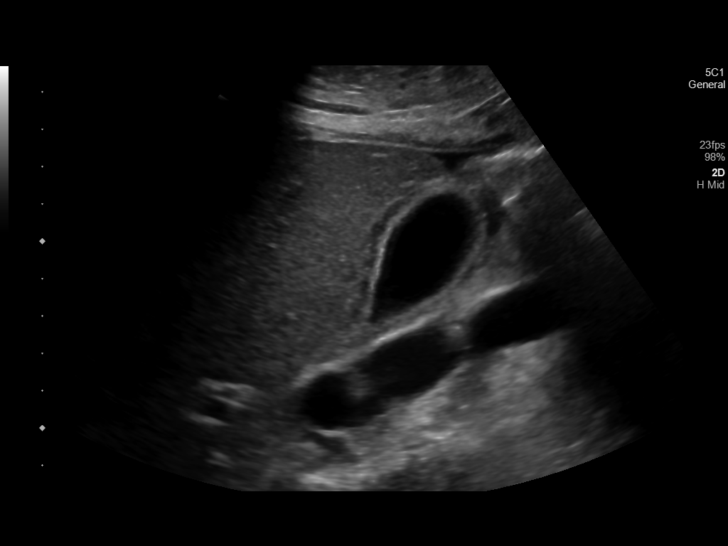
[im 4/44]
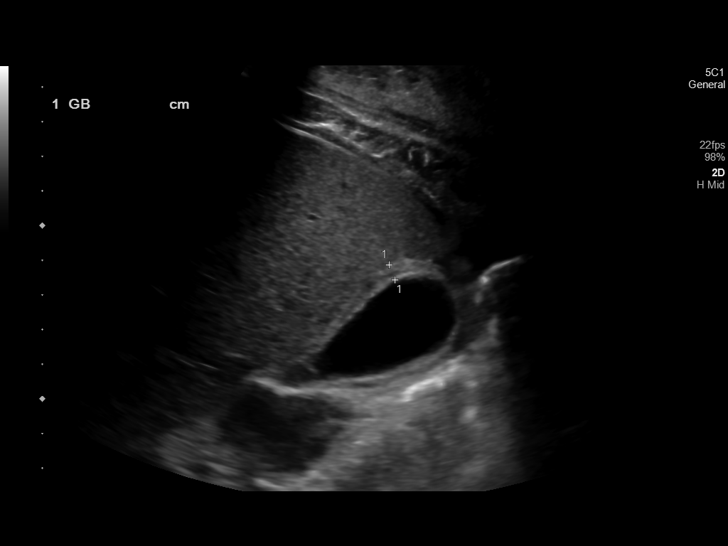
[im 8/44]
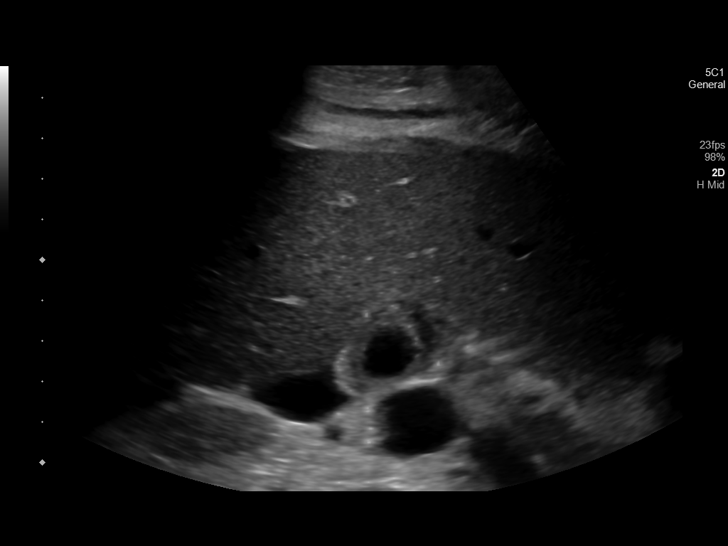
[im 11/44]
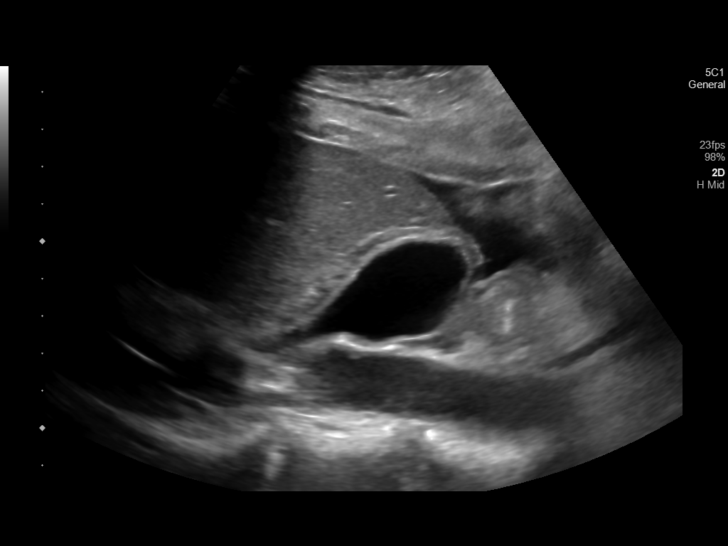
[im 15/44]
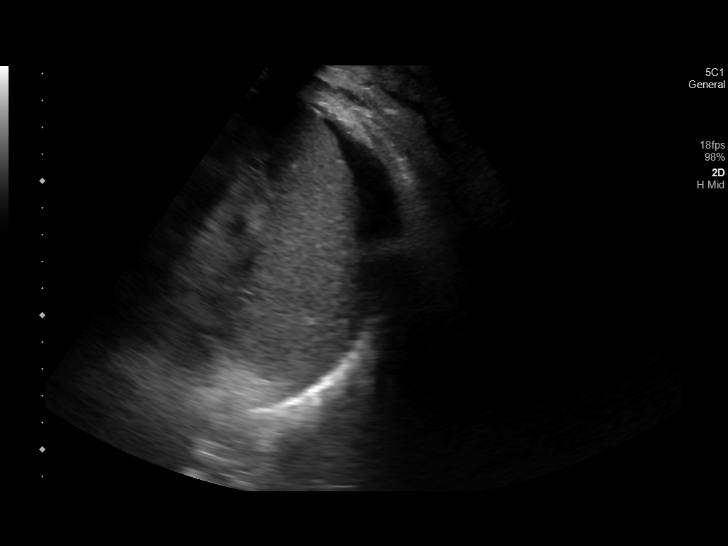
[im 17/44]
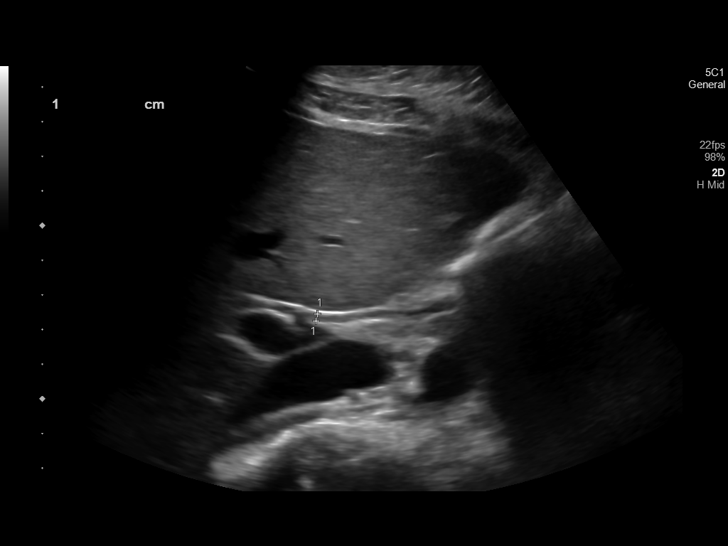
[im 20/44]
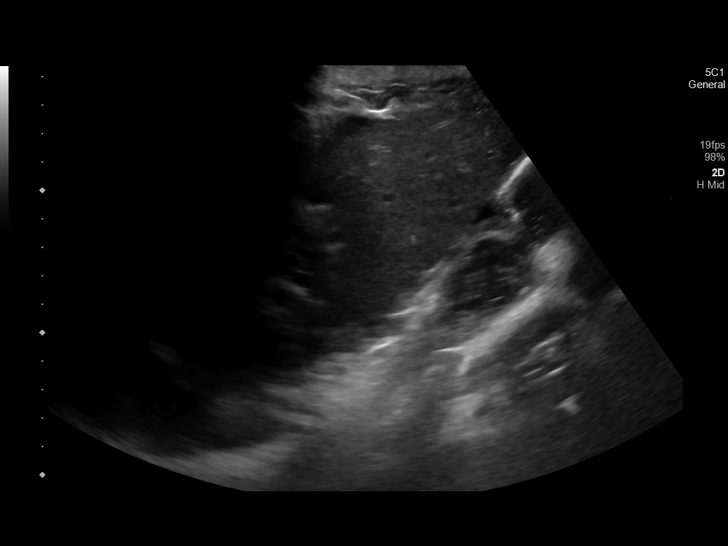
[im 24/44]
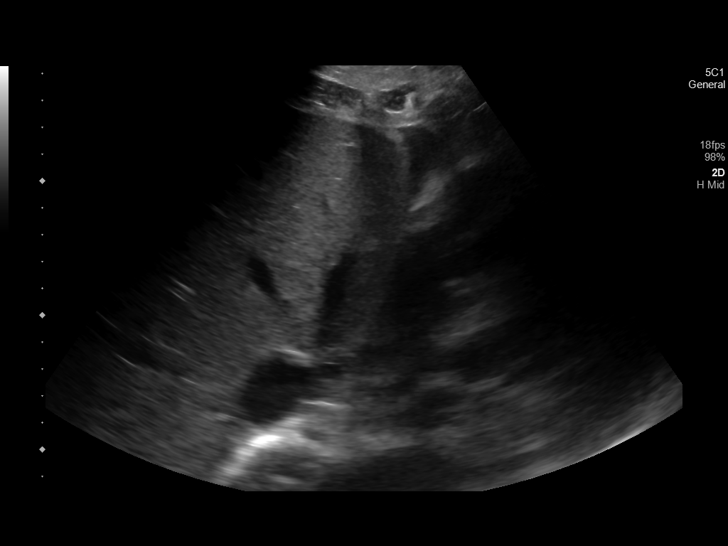
[im 27/44]
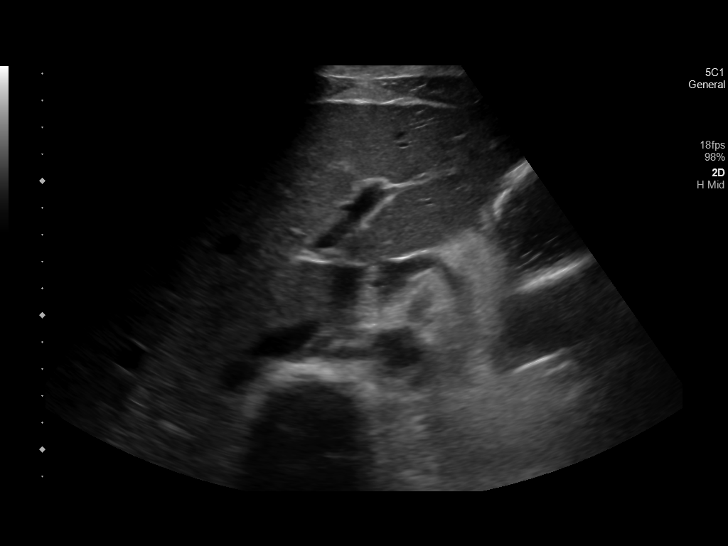
[im 29/44]
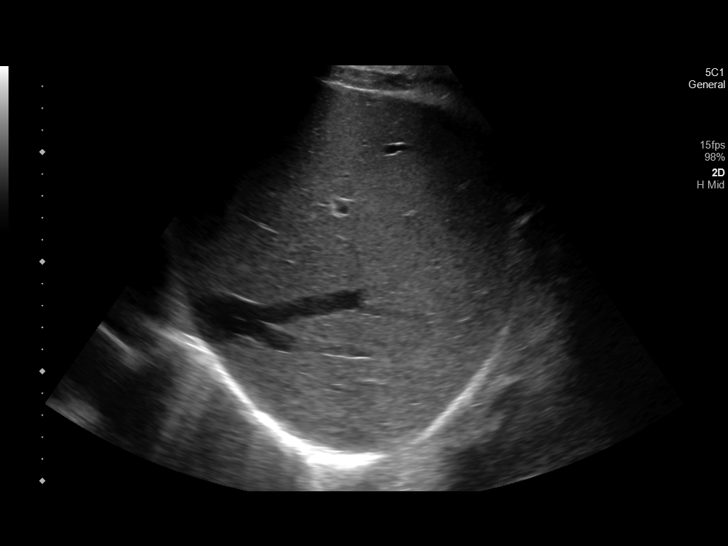
[im 33/44]
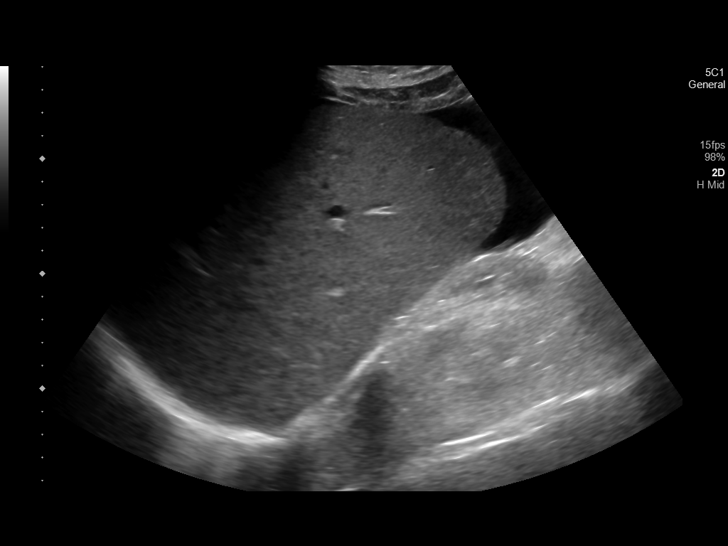
[im 36/44]
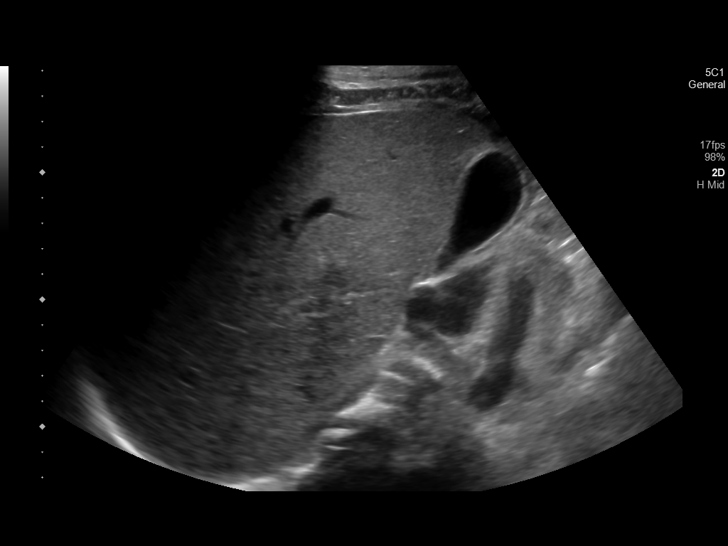
[im 40/44]
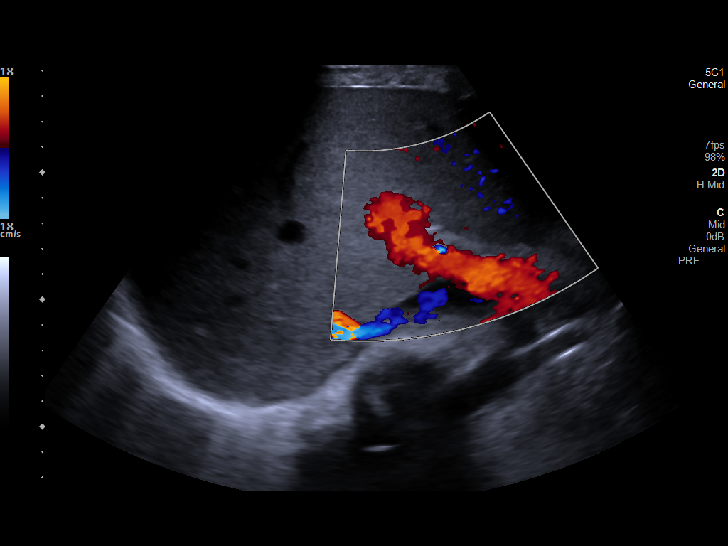
[im 44/44]
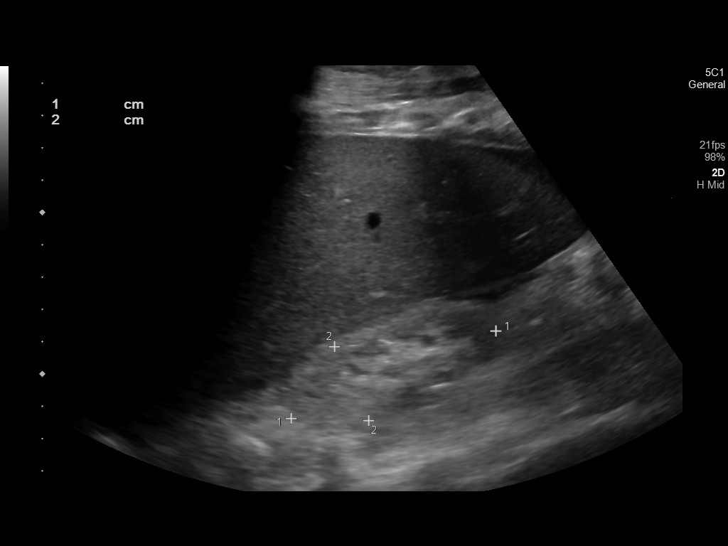

[14 of 25 positions shown; findings below may reference images not displayed]

FINDINGS: Gallbladder:

Diffuse gallbladder wall thickening noted. Wall measures up to
mm. Gallbladder is slightly contracted. No stones are present. No
sonographic Murphy sign is reported.

Common bile duct:

Diameter: 2.1 mm, within normal limits.

Liver:

No focal lesion identified. Within normal limits in parenchymal
echogenicity. Portal vein is patent on color Doppler imaging with
normal direction of blood flow towards the liver.

Other: The right kidney is atrophic and hyperechoic consistent with
known end-stage renal disease. Abdominal ascites is present.
IMPRESSION: 1. Mild diffuse abdominal ascites.
2. Mild diffuse gallbladder wall thickening. This may be secondary
to the ascites. No other signs of acute cholecystitis.
3. Normal sonographic appearance of the liver.
4. Atrophic echogenic right kidney consistent with known end-stage
renal disease.
